# Patient Record
Sex: Male | Born: 1975 | Race: White | Hispanic: No | Marital: Married | Smoking: Never smoker
Health system: Southern US, Community
[De-identification: ages and names within clinical notes are randomized; demographics above are authoritative.]

## PROBLEM LIST (undated history)

## (undated) DIAGNOSIS — K5190 Ulcerative colitis, unspecified, without complications: Secondary | ICD-10-CM

## (undated) DIAGNOSIS — E7800 Pure hypercholesterolemia, unspecified: Secondary | ICD-10-CM

## (undated) DIAGNOSIS — R030 Elevated blood-pressure reading, without diagnosis of hypertension: Secondary | ICD-10-CM

## (undated) DIAGNOSIS — J101 Influenza due to other identified influenza virus with other respiratory manifestations: Secondary | ICD-10-CM

## (undated) DIAGNOSIS — K6289 Other specified diseases of anus and rectum: Secondary | ICD-10-CM

## (undated) HISTORY — DX: Elevated blood-pressure reading, without diagnosis of hypertension: R03.0

## (undated) HISTORY — DX: Pure hypercholesterolemia, unspecified: E78.00

## (undated) HISTORY — DX: Influenza due to other identified influenza virus with other respiratory manifestations: J10.1

## (undated) HISTORY — DX: Other specified diseases of anus and rectum: K62.89

## (undated) HISTORY — PX: COLONOSCOPY W/ BIOPSIES: SHX1374

## (undated) HISTORY — DX: Ulcerative colitis, unspecified, without complications: K51.90

---

## 2003-09-25 ENCOUNTER — Emergency Department (HOSPITAL_COMMUNITY): Admission: EM | Admit: 2003-09-25 | Discharge: 2003-09-25 | Payer: Self-pay | Admitting: Emergency Medicine

## 2014-05-22 ENCOUNTER — Encounter: Payer: Self-pay | Admitting: Family Medicine

## 2014-05-22 ENCOUNTER — Ambulatory Visit (INDEPENDENT_AMBULATORY_CARE_PROVIDER_SITE_OTHER): Payer: No Typology Code available for payment source | Admitting: Family Medicine

## 2014-05-22 VITALS — BP 151/95 | HR 108 | Temp 98.6°F | Resp 18 | Ht 69.0 in | Wt 191.0 lb

## 2014-05-22 DIAGNOSIS — K51918 Ulcerative colitis, unspecified with other complication: Secondary | ICD-10-CM | POA: Diagnosis not present

## 2014-05-22 DIAGNOSIS — Z23 Encounter for immunization: Secondary | ICD-10-CM

## 2014-05-22 DIAGNOSIS — R197 Diarrhea, unspecified: Secondary | ICD-10-CM

## 2014-05-22 DIAGNOSIS — K5190 Ulcerative colitis, unspecified, without complications: Secondary | ICD-10-CM | POA: Diagnosis not present

## 2014-05-22 LAB — COMPREHENSIVE METABOLIC PANEL
ALBUMIN: 3.8 g/dL (ref 3.5–5.2)
ALK PHOS: 73 U/L (ref 39–117)
ALT: 16 U/L (ref 0–53)
AST: 18 U/L (ref 0–37)
BUN: 15 mg/dL (ref 6–23)
CALCIUM: 9.6 mg/dL (ref 8.4–10.5)
CO2: 28 mEq/L (ref 19–32)
CREATININE: 1.1 mg/dL (ref 0.4–1.5)
Chloride: 100 mEq/L (ref 96–112)
GFR: 83.76 mL/min (ref >60.00)
GLUCOSE: 95 mg/dL (ref 70–99)
Potassium: 4.7 mEq/L (ref 3.5–5.1)
Sodium: 135 mEq/L (ref 135–145)
Total Bilirubin: 0.6 mg/dL (ref 0.2–1.2)
Total Protein: 8.3 g/dL (ref 6.0–8.3)

## 2014-05-22 LAB — CBC WITH DIFFERENTIAL/PLATELET
BASOS PCT: 0.3 % (ref 0.0–3.0)
Basophils Absolute: 0 10*3/uL (ref 0.0–0.1)
EOS PCT: 5.5 % — AB (ref 0.0–5.0)
Eosinophils Absolute: 0.4 10*3/uL (ref 0.0–0.7)
HCT: 48 % (ref 39.0–52.0)
Hemoglobin: 15.8 g/dL (ref 13.0–17.0)
Lymphocytes Relative: 18.5 % (ref 12.0–46.0)
Lymphs Abs: 1.2 10*3/uL (ref 0.7–4.0)
MCHC: 33 g/dL (ref 30.0–36.0)
MCV: 91.3 fl (ref 78.0–100.0)
MONO ABS: 0.6 10*3/uL (ref 0.1–1.0)
Monocytes Relative: 9.5 % (ref 3.0–12.0)
Neutro Abs: 4.2 10*3/uL (ref 1.4–7.7)
Neutrophils Relative %: 66.2 % (ref 43.0–77.0)
Platelets: 258 10*3/uL (ref 150.0–400.0)
RBC: 5.26 Mil/uL (ref 4.22–5.81)
RDW: 13.4 % (ref 11.5–15.5)
WBC: 6.4 10*3/uL (ref 4.0–10.5)

## 2014-05-22 MED ORDER — PREDNISONE 20 MG PO TABS: ORAL_TABLET | ORAL | Status: DC

## 2014-05-22 NOTE — Progress Notes (Signed)
Pre visit review using our clinic review tool, if applicable. No additional management support is needed unless otherwise documented below in the visit note. 

## 2014-05-22 NOTE — Assessment & Plan Note (Signed)
He feels like he knows his body and says that he does feel like this is a flare of his UC. Will give prednisone 16m qd x 5d, then 233mqd x 5d, then 10105md x 6d. Will refer to [facility] for ongoing f/u of this condition, particularly with regard to his question of oral "controller" med for this rather than suppository. However, he has admittedly had very little problem with this disease while not on any meds at all. Will check CBC and CMET today.

## 2014-05-22 NOTE — Progress Notes (Signed)
Office Note 05/22/2014  CC:  Chief Complaint  Patient presents with  . Rectal Bleeding   HPI:  Keith Jensen is a 38 y.o. White male who is here to establish care, discuss diarrhea that he suspects is a flare of his known dx of ulcerative colitis. Previous PMD: Dr. Inda Merlin at [facility] on [place]. Old records were not reviewed prior to or during today's visit.  Onset of diarrhea about 1 week ago, some crampiness in lower abdomen between BMs.  Typically 3 stools per day, maybe a tint of reddish blood in a mostly watery bm with some particulate matter but nothing formed. BM does not hurt when it comes out.  No fevers, no fatigue, no joint aches, no rash.  Appetite same.  He has not adjusted his diet much in response to the sx's.   Has ulcerative colitis, most recent flare was 2 yrs ago.   Says since his flares have been so infrequent he did not take his canasa, but just started it again during this flare: took a suppository yesterday and day before. He does not like taking suppositories and asks if there is anything else for [name] term treatment he can take that is oral, not suppository.  Past Medical History  Diagnosis Date  . Ulcerative colitis dx'd in early 38s    [facility] did dx but beyond this the condition was managed by his PCP.  Marland Kitchen Elevated blood pressure reading without diagnosis of hypertension     History reviewed. No pertinent past surgical history.  Family History  Problem Relation Age of Onset  . Heart attack Mother     per pt she committed suicide by med overdose    History   Social History  . Marital Status: Married    Spouse Name: N/A    Number of Children: N/A  . Years of Education: N/A   Occupational History  . Not on file.   Social History Main Topics  . Smoking status: Never Smoker   . Smokeless tobacco: Never Used  . Alcohol Use: Yes     Comment: 2-3 times weekly  . Drug Use: No  . Sexual Activity: Not on file   Other Topics Concern  . Not on  file   Social History Narrative   Married, no children.  Father healthy, sister healthy.  Mom d. Suicide.   Education: Scientist, product/process development.   Occupation: Engineer, mining.   No tobacco.  Beer 2-3 times per week.  No drugs.   Exercises: lifting wt's and biking.   MEDS: Canasa suppository as stated in HPI  No Known Allergies  ROS Review of Systems  Constitutional: Negative for fever, chills, appetite change and fatigue.  HENT: Negative for congestion, dental problem, ear pain and sore throat.   Eyes: Negative for discharge, redness and visual disturbance.  Respiratory: Negative for cough, chest tightness, shortness of breath and wheezing.   Cardiovascular: Negative for chest pain, palpitations and leg swelling.  Gastrointestinal: Positive for abdominal pain and diarrhea. Negative for nausea, vomiting, blood in stool, abdominal distention, anal bleeding and rectal pain.  Genitourinary: Negative for dysuria, urgency, frequency, hematuria, flank pain and difficulty urinating.  Musculoskeletal: Negative for arthralgias, back pain, joint swelling, myalgias and neck stiffness.  Skin: Negative for pallor and rash.  Neurological: Negative for dizziness, speech difficulty, weakness and headaches.  Hematological: Negative for adenopathy. Does not bruise/bleed easily.  Psychiatric/Behavioral: Negative for confusion and sleep disturbance. The patient is not nervous/anxious.     PE; Blood pressure  151/95, pulse 108, temperature 98.6 F (37 C), temperature source Temporal, resp. rate 18, height 5' 9"  (1.753 m), weight 191 lb (86.637 kg), SpO2 100.00%. Gen: Alert, well appearing.  Patient is oriented to person, place, time, and situation. SKS:HNGI: no injection, icteris, swelling, or exudate.  EOMI, PERRLA. Mouth: lips without lesion/swelling.  Oral mucosa pink and moist. Oropharynx without erythema, exudate, or swelling.  Neck - No masses or thyromegaly or limitation in range of motion CV: RRR, no  m/r/g.   LUNGS: CTA bilat, nonlabored resps, good aeration in all lung fields. ABD: soft, NT, ND, BS normal.  No hepatospenomegaly or mass.  No bruits. EXT: no clubbing, cyanosis, or edema.  Rectal: mild rectal tenderness, normal tone, no fissure or fistula tracts, no hemorrhoids, prostate without nodule or tenderness.   Hemoccult in the exam room: stool wipings equivocal pos  Pertinent labs:  None today  ASSESSMENT AND PLAN:   New pt; obtain old records.  Ulcerative colitis He feels like he knows his body and says that he does feel like this is a flare of his UC. Will give prednisone 34m qd x 5d, then 248mqd x 5d, then 1062md x 6d. Will refer to [name] for ongoing f/u of this condition, particularly with regard to his question of oral "controller" med for this rather than suppository. However, he has admittedly had very little problem with this disease while not on any meds at all. Will check CBC and CMET today.   An After Visit Summary was printed and given to the patient.  Return in about 4 weeks (around 06/19/2014) for f/u elevated bp.

## 2014-05-22 NOTE — Patient Instructions (Signed)
Check your blood pressure at least twice per week for the next 1 month. Bring these measurements to the office for f/u visit in 1 mo for review.

## 2014-05-23 ENCOUNTER — Encounter: Payer: Self-pay | Admitting: Family Medicine

## 2014-06-01 ENCOUNTER — Other Ambulatory Visit: Payer: Self-pay | Admitting: Family Medicine

## 2014-06-01 NOTE — Telephone Encounter (Signed)
Please advise refill? 

## 2014-06-04 NOTE — Telephone Encounter (Signed)
rf request for prednisone 20 mg.  Please advise.

## 2014-06-07 ENCOUNTER — Telehealth: Payer: Self-pay | Admitting: Family Medicine

## 2014-06-07 NOTE — Telephone Encounter (Signed)
Pt wife aware.  Patient will follow Dr. Idelle Leech advise.

## 2014-06-07 NOTE — Telephone Encounter (Signed)
LM for pt's wife to CB.

## 2014-06-07 NOTE — Telephone Encounter (Signed)
Patient Information:  Caller Name: Stanton Kidney  Phone: [phone]  Patient: Keith Jensen, Keith Jensen  Gender: Male  DOB: 27-Apr-1976  Age: 38 Years  PCP: Ricardo [facility])  Office Follow Up:  Does the office need to follow up with this patient?: Yes  Instructions For The Office: Please f/u with pt's spouse/Mary to advise, thank you.  RN Note:  Daneil Dan to call [facility] to inquire if any cancellations.  Symptoms  Reason For Call & Symptoms: Pt's spouse/Mary reports pt has bloody  diarrhea with  abdominal cramping.  Stanton Kidney  reports he was seen in the office and on prednisone but abdominal pain and bloody diarrhea is not improving.  Mary requesting call back from provider.  Stanton Kidney states pt has appt with gastroenterolgy 08/01/14.  Reviewed Health History In EMR: Yes  Reviewed Medications In EMR: Yes  Reviewed Allergies In EMR: Yes  Reviewed Surgeries / Procedures: Yes  Date of Onset of Symptoms: 05/08/2014  Guideline(s) Used:  No Protocol Available - Sick Adult  Disposition Per Guideline:   Discuss with PCP and Callback by Nurse Today  Reason For Disposition Reached:   Nursing judgment  Advice Given:  Call Back If:  New symptoms develop  You become worse.  Call Back If:  New symptoms develop  You become worse.  Patient Will Follow Care Advice:  YES

## 2014-06-07 NOTE — Telephone Encounter (Signed)
Please advise 

## 2014-06-07 NOTE — Telephone Encounter (Signed)
I recommend this patient go to the ED for evaluation.

## 2014-06-13 ENCOUNTER — Ambulatory Visit (INDEPENDENT_AMBULATORY_CARE_PROVIDER_SITE_OTHER): Payer: No Typology Code available for payment source | Admitting: Physician Assistant

## 2014-06-13 ENCOUNTER — Encounter: Payer: Self-pay | Admitting: Physician Assistant

## 2014-06-13 VITALS — BP 124/80 | HR 74 | Ht 69.0 in | Wt 180.0 lb

## 2014-06-13 DIAGNOSIS — K51919 Ulcerative colitis, unspecified with unspecified complications: Secondary | ICD-10-CM

## 2014-06-13 DIAGNOSIS — R197 Diarrhea, unspecified: Secondary | ICD-10-CM

## 2014-06-13 MED ORDER — MESALAMINE 1.2 G PO TBEC: 2.4000 g | DELAYED_RELEASE_TABLET | Freq: Every day | ORAL | Status: DC

## 2014-06-13 MED ORDER — MOVIPREP 100 G PO SOLR: 1.0000 | Freq: Once | ORAL | Status: DC

## 2014-06-13 MED ORDER — PREDNISONE 10 MG PO TABS: ORAL_TABLET | ORAL | Status: DC

## 2014-06-13 MED ORDER — MESALAMINE 1000 MG RE SUPP: 1000.0000 mg | Freq: Two times a day (BID) | RECTAL | Status: DC

## 2014-06-13 NOTE — Progress Notes (Signed)
Reviewed and agree with management plan. In addition please send a full GI pathogen panel to R/O multiple potential infectious etiologies.   Pricilla Riffle. Fuller Plan, MD [facility]

## 2014-06-13 NOTE — Progress Notes (Signed)
Patient ID: Keith Jensen, male   DOB: 1976/05/30, 38 y.o.   MRN: [id number]    HPI:  Keith Jensen is 38 year old male referred for evaluation by Dr. Nanine Means due to a Kratzke-standing history of ulcerative colitis.  Patient states that he was diagnosed with ulcerative active colitis 15 years ago. He was primarily managed by his primary care provider, Dr. Inda Merlin at the time. He reports that he has seen a gastroenterologist at [facility] a few times however he doesn't remember the name of the gastroenterologist.for a short period of time, due to insurance reasons he also saw a gastroenterologist in [facility] but he does not know the physician's name and says he does not remember where the offices. He has never been on oral medications for his ulcerative colitis. His last flare was in 2012. He last had a flexible sigmoidoscopy 5 or 6 years ago. 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   On October 15 of this year he started to have lower abdominal cramping, bloody diarrhea and tenesmus, and mucous. He was evaluated at Dr. Rulon Sera office and started on prednisone. While on 40 mg of prednisone he felt well but once he got below 40 mg his diarrhea got worse. He is currently having 10-12 watery bowel movements with blood and urgency daily. He does have nocturnal stooling. He has constant tenesmus. He had been using ibuprofen intermittently through September and early October for joint pain but discontinued this when his diarrhea started. He has no nausea or vomiting. He states he is afraid to eat because everything runs right through him. 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  Past Medical History  Diagnosis Date  . 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  [facility] notes state that since 2001 he had multiple episodes of proctitis, proctoscopy done 2005 but biopsies not performed, then pt  referred to GI and diagnostic colonoscopy was recommended but pt failed to schedule this b/c his bleeding resolved (most recent GI visit as per their old notes is 01/18/2012, with Dr. Wynetta Emery)  . Elevated blood pressure reading without diagnosis of hypertension   . UC (ulcerative colitis)   . Ulcerative colitis     Past Surgical History  Procedure Laterality Date  . Neg hx     Family History  Problem Relation Age of Onset  . Heart attack Mother     per pt she had accidental overdose  . Colon cancer Neg Hx   . Colon polyps Father    History  Substance Use Topics  . Smoking status: Never Smoker   . Smokeless tobacco: Never Used  . Alcohol Use: Yes     Comment: 2-3 times weekly   No current outpatient prescriptions on file.   No current facility-administered medications for this visit.   No Known Allergies   Review of Systems: Gen: Denies any fever, chills, sweats, anorexia, fatigue, weakness, malaise, weight loss, and sleep disorder CV: Denies chest pain, angina, palpitations, syncope, orthopnea, PND, peripheral edema, and claudication. Resp: Denies dyspnea at rest, dyspnea with exercise, cough, sputum, wheezing, coughing up blood, and pleurisy. GI: Denies vomiting blood, jaundice, and fecal incontinence.   Denies dysphagia or odynophagia. GU : Denies urinary burning, blood in urine, urinary frequency, urinary hesitancy, nocturnal urination, and urinary incontinence. MS: Denies joint pain, limitation of movement, and  swelling, stiffness, low back pain, extremity pain. Denies muscle weakness, cramps, atrophy.  Derm: Denies rash, itching, dry skin, hives, moles, warts, or unhealing ulcers.  Psych: Denies depression, anxiety, memory loss, suicidal ideation, hallucinations, paranoia, and confusion. Heme: Denies bruising, bleeding, and enlarged lymph nodes. Neuro:  Denies any headaches, dizziness, paresthesias. Endo:  Denies any problems with DM, thyroid, adrenal function   LAB  RESULTS: Blood work on 05/22/2013 had a CBC with a white blood count of 6.4, hemoglobin 15.8, hematocrit 48, platelets 258,000.  Chem profile showed AST 18, ALT 16, total bili 0.6, alkaline phosphatase 73.  Physical Exam: BP 124/80 mmHg  Pulse 74  Ht 5' 9"  (1.753 m)  Wt 180 lb (81.647 kg)  BMI 26.57 kg/m2 Constitutional: Pleasant,well-developed,male in no acute distress. HEENT: Normocephalic and atraumatic. Conjunctivae are normal. No scleral icterus. Neck supple. No thyromegaly Cardiovascular: Normal rate, regular rhythm.  Pulmonary/chest: Effort normal and breath sounds normal. No wheezing, rales or rhonchi. Abdominal: Soft, nondistended, nontender. Bowel sounds active throughout. There are no masses palpable. No hepatomegaly. Rectal:bright red blood and mucus Extremities: no edema Lymphadenopathy: No cervical adenopathy noted. Neurological: Alert and oriented to person place and time. Skin: Skin is warm and dry. No rashes noted. Psychiatric: Normal mood and affect. Behavior is normal.  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 He appears to be experiencing a flare and will be started on prednisone 60 mg daily and decrease by 10 mg weekly to complete. He will be started on Lialda 1.2 g 2 tablets each morning along with Canasa suppositories twice a day. A stool for C. Difficile will be obtained. He will be scheduled for a colonoscopy to assess the extent of disease involvement.The risks, benefits, and alternatives to colonoscopy with possible biopsy and possible polypectomy were discussed with the patient and they consent to proceed.  The procedure will be scheduled with Dr. Fuller Plan who the case has been reviewed with.the patient will follow up in the office 2 weeks after his colonoscopy to assess response to the above-mentioned therapy. He has however, been advised to call us at any time if he is having  worsening symptoms or has any questions.    Tryton Bodi, Vita Barley PA-C 06/13/2014, 11:24 AM

## 2014-06-13 NOTE — Addendum Note (Signed)
Addended by: Jackolyn Confer A on: 06/13/2014 02:25 PM   Modules accepted: Orders

## 2014-06-13 NOTE — Patient Instructions (Signed)
You have been scheduled for a colonoscopy. Please follow written instructions given to you at your visit today.  Please pick up your prep kit at the pharmacy within the next 1-3 days. If you use inhalers (even only as needed), please bring them with you on the day of your procedure. Your physician has requested that you go to www.startemmi.com and enter the access code given to you at your visit today. This web site gives a general overview about your procedure. However, you should still follow specific instructions given to you by our office regarding your preparation for the procedure.  We have sent the following medications to your pharmacy for you to pick up at your convenience: Prednisone Lialda Canasa suppositories  Your physician has requested that you go to the basement for the following lab work before leaving today: Stool for C-diff  You will be due for a recall office visit in12/2015. We will send you a reminder in the mail when it gets closer to that time.  Low-Fiber Diet Fiber is found in fruits, vegetables, and whole grains. A low-fiber diet restricts fibrous foods that are not digested in the small intestine. A diet containing about 10-15 grams of fiber per day is considered low fiber. Low-fiber diets may be used to:  Promote healing and rest the bowel during intestinal flare-ups.  Prevent blockage of a partially obstructed or narrowed gastrointestinal tract.  Reduce fecal weight and volume.  Slow the movement of feces. You may be on a low-fiber diet as a transitional diet following surgery, after an injury (trauma), or because of a short (acute) or lifelong (chronic) illness. Your health care provider will determine the length of time you need to stay on this diet.  WHAT DO I NEED TO KNOW ABOUT A LOW-FIBER DIET? Always check the fiber content on the packaging's Nutrition Facts label, especially on foods from the grains list. Ask your dietitian if you have questions about  specific foods that are related to your condition, especially if the food is not listed below. In general, a low-fiber food will have less than 2 g of fiber. WHAT FOODS CAN I EAT? Grains All breads and crackers made with white flour. Sweet rolls, doughnuts, waffles, pancakes, [place] toast, bagels. Pretzels, Melba toast, zwieback. Well-cooked cereals, such as cornmeal, farina, or cream cereals. Dry cereals that do not contain whole grains, fruit, or nuts, such as refined corn, wheat, rice, and oat cereals. Potatoes prepared any way without skins, plain pastas and noodles, refined white rice. Use white flour for baking and making sauces. Use allowed list of grains for casseroles, dumplings, and puddings.  Vegetables Strained tomato and vegetable juices. Fresh lettuce, cucumber, spinach. Well-cooked (no skin or pulp) or canned vegetables, such as asparagus, bean sprouts, beets, carrots, green beans, mushrooms, potatoes, pumpkin, spinach, yellow squash, tomato sauce/puree, turnips, yams, and zucchini. Keep servings limited to  cup.  Fruits All fruit juices except prune juice. Cooked or canned fruits without skin and seeds, such as applesauce, apricots, cherries, fruit cocktail, grapefruit, grapes, mandarin oranges, melons, peaches, pears, pineapple, and plums. Fresh fruits without skin, such as apricots, avocados, bananas, melons, pineapple, nectarines, and peaches. Keep servings limited to  cup or 1 piece.  Meat and Other Protein Sources Ground or well-cooked tender beef, ham, veal, lamb, pork, or poultry. Eggs, plain cheese. Fish, oysters, shrimp, lobster, and other seafood. Liver, organ meats. Smooth nut butters. Dairy All milk products and alternative dairy substitutes, such as soy, rice, almond, and coconut, not  containing added whole nuts, seeds, or added fruit. Beverages Decaf coffee, fruit, and vegetable juices or smoothies (small amounts, with no pulp or skins, and with fruits from allowed  list), sports drinks, herbal tea. Condiments Ketchup, mustard, vinegar, cream sauce, cheese sauce, cocoa powder. Spices in moderation, such as allspice, basil, bay leaves, celery powder or leaves, cinnamon, cumin powder, curry powder, ginger, mace, marjoram, onion or garlic powder, oregano, paprika, parsley flakes, ground pepper, rosemary, sage, savory, tarragon, thyme, and turmeric. Sweets and Desserts Plain cakes and cookies, pie made with allowed fruit, pudding, custard, cream pie. Gelatin, fruit, ice, sherbet, frozen ice pops. Ice cream, ice milk without nuts. Plain hard candy, honey, jelly, molasses, syrup, sugar, chocolate syrup, gumdrops, marshmallows. Limit overall sugar intake.  Fats and Oil Margarine, butter, cream, mayonnaise, salad oils, plain salad dressings made from allowed foods. Choose healthy fats such as olive oil, canola oil, and omega-3 fatty acids (such as found in salmon or tuna) when possible.  Other Bouillon, broth, or cream soups made from allowed foods. Any strained soup. Casseroles or mixed dishes made with allowed foods. The items listed above may not be a complete list of recommended foods or beverages. Contact your dietitian for more options.  WHAT FOODS ARE NOT RECOMMENDED? Grains All whole wheat and whole grain breads and crackers. Multigrains, rye, bran seeds, nuts, or coconut. Cereals containing whole grains, multigrains, bran, coconut, nuts, raisins. Cooked or dry oatmeal, steel-cut oats. Coarse wheat cereals, granola. Cereals advertised as high fiber. Potato skins. Whole grain pasta, wild or brown rice. Popcorn. Coconut flour. Bran, buckwheat, corn bread, multigrains, rye, wheat germ.  Vegetables Fresh, cooked or canned vegetables, such as artichokes, asparagus, beet greens, broccoli, Brussels sprouts, cabbage, celery, cauliflower, corn, eggplant, kale, legumes or beans, okra, peas, and tomatoes. Avoid large servings of any vegetables, especially raw vegetables.   Fruits Fresh fruits, such as apples with or without skin, berries, cherries, figs, grapes, grapefruit, guavas, kiwis, mangoes, oranges, papayas, pears, persimmons, pineapple, and pomegranate. Prune juice and juices with pulp, stewed or dried prunes. Dried fruits, dates, raisins. Fruit seeds or skins. Avoid large servings of all fresh fruits. Meats and Other Protein Sources Tough, fibrous meats with gristle. Chunky nut butter. Cheese made with seeds, nuts, or other foods not recommended. Nuts, seeds, legumes (beans, including baked beans), dried peas, beans, lentils.  Dairy Yogurt or cheese that contains nuts, seeds, or added fruit.  Beverages Fruit juices with high pulp, prune juice. Caffeinated coffee and teas.  Condiments Coconut, maple syrup, pickles, olives. Sweets and Desserts Desserts, cookies, or candies that contain nuts or coconut, chunky peanut butter, dried fruits. Jams, preserves with seeds, marmalade. Large amounts of sugar and sweets. Any other dessert made with fruits from the not recommended list.  Other Soups made from vegetables that are not recommended or that contain other foods not recommended.  The items listed above may not be a complete list of foods and beverages to avoid. Contact your dietitian for more information. Document Released: 01/16/2002 Document Revised: 08/01/2013 Document Reviewed: 06/19/2013 [facility] Patient Information 2015 [place], [place]. This information is not intended to replace advice given to you by your health care provider. Make sure you discuss any questions you have with your health care provider.

## 2014-06-14 ENCOUNTER — Telehealth: Payer: Self-pay | Admitting: Gastroenterology

## 2014-06-15 NOTE — Telephone Encounter (Signed)
Patient's wife advised that needs a GI pathogen panel and will need to come in and pick up the container from the lab

## 2014-06-18 ENCOUNTER — Telehealth: Payer: Self-pay | Admitting: Physician Assistant

## 2014-06-18 NOTE — Telephone Encounter (Signed)
Spoke with patient's wife and she states the patient is having a lot of abdominal cramping and last night had 4 bloody episodes without stool. He is hurting in lower abdomen. She states he is just passing blood without stool now. He is taking Prednisone 60 mg, Canasa suppositories and Lialda 2 tablets daily. Spoke with Cecille Rubin Hvozdovic, PA-C and patient needs to go to [facility] ED for evaluation. Spoke with patient's wife and gave her recommendation. She states patient will go to ED.

## 2014-06-20 ENCOUNTER — Emergency Department (HOSPITAL_COMMUNITY): Payer: No Typology Code available for payment source

## 2014-06-20 ENCOUNTER — Encounter (HOSPITAL_COMMUNITY): Payer: Self-pay | Admitting: Emergency Medicine

## 2014-06-20 ENCOUNTER — Ambulatory Visit: Payer: No Typology Code available for payment source | Admitting: Family Medicine

## 2014-06-20 ENCOUNTER — Inpatient Hospital Stay (HOSPITAL_COMMUNITY)
Admission: EM | Admit: 2014-06-20 | Discharge: 2014-06-26 | DRG: 387 | Disposition: A | Discharge status: Home / Self Care | Payer: No Typology Code available for payment source | Attending: Internal Medicine | Admitting: Internal Medicine

## 2014-06-20 DIAGNOSIS — E876 Hypokalemia: Secondary | ICD-10-CM | POA: Diagnosis not present

## 2014-06-20 DIAGNOSIS — D72829 Elevated white blood cell count, unspecified: Secondary | ICD-10-CM | POA: Diagnosis present

## 2014-06-20 DIAGNOSIS — R109 Unspecified abdominal pain: Secondary | ICD-10-CM | POA: Diagnosis present

## 2014-06-20 DIAGNOSIS — Z7952 Long term (current) use of systemic steroids: Secondary | ICD-10-CM | POA: Diagnosis not present

## 2014-06-20 DIAGNOSIS — Z8249 Family history of ischemic heart disease and other diseases of the circulatory system: Secondary | ICD-10-CM

## 2014-06-20 DIAGNOSIS — Z79899 Other long term (current) drug therapy: Secondary | ICD-10-CM | POA: Diagnosis not present

## 2014-06-20 DIAGNOSIS — T380X5A Adverse effect of glucocorticoids and synthetic analogues, initial encounter: Secondary | ICD-10-CM | POA: Diagnosis present

## 2014-06-20 DIAGNOSIS — K51911 Ulcerative colitis, unspecified with rectal bleeding: Secondary | ICD-10-CM

## 2014-06-20 DIAGNOSIS — K5190 Ulcerative colitis, unspecified, without complications: Secondary | ICD-10-CM | POA: Diagnosis present

## 2014-06-20 DIAGNOSIS — Z8371 Family history of colonic polyps: Secondary | ICD-10-CM | POA: Diagnosis not present

## 2014-06-20 DIAGNOSIS — K51918 Ulcerative colitis, unspecified with other complication: Secondary | ICD-10-CM | POA: Diagnosis not present

## 2014-06-20 DIAGNOSIS — Z139 Encounter for screening, unspecified: Secondary | ICD-10-CM

## 2014-06-20 LAB — CBC WITH DIFFERENTIAL/PLATELET
Basophils Absolute: 0 10*3/uL (ref 0.0–0.1)
Basophils Relative: 0 % (ref 0–1)
EOS ABS: 0 10*3/uL (ref 0.0–0.7)
EOS PCT: 0 % (ref 0–5)
HCT: 38.6 % — ABNORMAL LOW (ref 39.0–52.0)
Hemoglobin: 13 g/dL (ref 13.0–17.0)
LYMPHS PCT: 8 % — AB (ref 12–46)
Lymphs Abs: 1.3 10*3/uL (ref 0.7–4.0)
MCH: 30 pg (ref 26.0–34.0)
MCHC: 33.7 g/dL (ref 30.0–36.0)
MCV: 89.1 fL (ref 78.0–100.0)
MONOS PCT: 18 % — AB (ref 3–12)
Monocytes Absolute: 2.8 10*3/uL — ABNORMAL HIGH (ref 0.1–1.0)
Neutro Abs: 11.6 10*3/uL — ABNORMAL HIGH (ref 1.7–7.7)
Neutrophils Relative %: 74 % (ref 43–77)
PLATELETS: 403 10*3/uL — AB (ref 150–400)
RBC: 4.33 MIL/uL (ref 4.22–5.81)
RDW: 12.8 % (ref 11.5–15.5)
WBC: 15.7 10*3/uL — AB (ref 4.0–10.5)

## 2014-06-20 LAB — SEDIMENTATION RATE: Sed Rate: 60 mm/hr — ABNORMAL HIGH (ref 0–16)

## 2014-06-20 LAB — COMPREHENSIVE METABOLIC PANEL
ALK PHOS: 66 U/L (ref 39–117)
ALT: 11 U/L (ref 0–53)
ANION GAP: 13 (ref 5–15)
AST: 8 U/L (ref 0–37)
Albumin: 2.6 g/dL — ABNORMAL LOW (ref 3.5–5.2)
BUN: 12 mg/dL (ref 6–23)
CO2: 30 meq/L (ref 19–32)
Calcium: 8.7 mg/dL (ref 8.4–10.5)
Chloride: 94 mEq/L — ABNORMAL LOW (ref 96–112)
Creatinine, Ser: 1 mg/dL (ref 0.50–1.35)
GFR calc non Af Amer: >90 mL/min (ref >90)
GLUCOSE: 112 mg/dL — AB (ref 70–99)
POTASSIUM: 3.4 meq/L — AB (ref 3.7–5.3)
SODIUM: 137 meq/L (ref 137–147)
Total Bilirubin: 0.2 mg/dL — ABNORMAL LOW (ref 0.3–1.2)
Total Protein: 7 g/dL (ref 6.0–8.3)

## 2014-06-20 LAB — LIPASE, BLOOD: Lipase: 31 U/L (ref 11–59)

## 2014-06-20 MED ORDER — MORPHINE SULFATE 4 MG/ML IJ SOLN
4.0000 mg | INTRAMUSCULAR | Status: AC | PRN
  Administered 2014-06-20 – 2014-06-21 (×3): 4 mg via INTRAVENOUS
  Filled 2014-06-20 (×3): qty 1

## 2014-06-20 MED ORDER — IOHEXOL 300 MG/ML  SOLN
50.0000 mL | Freq: Once | INTRAMUSCULAR | Status: AC | PRN
  Administered 2014-06-20: 50 mL via ORAL

## 2014-06-20 MED ORDER — DIPHENHYDRAMINE HCL 25 MG PO CAPS
25.0000 mg | ORAL_CAPSULE | Freq: Once | ORAL | Status: AC
  Administered 2014-06-20: 25 mg via ORAL
  Filled 2014-06-20: qty 1

## 2014-06-20 MED ORDER — SODIUM CHLORIDE 0.9 % IV BOLUS (SEPSIS)
1000.0000 mL | Freq: Once | INTRAVENOUS | Status: AC
  Administered 2014-06-20: 1000 mL via INTRAVENOUS

## 2014-06-20 MED ORDER — SODIUM CHLORIDE 0.9 % IV SOLN
Freq: Once | INTRAVENOUS | Status: AC
Start: 2014-06-20 — End: 2014-06-21
  Administered 2014-06-20: 22:00:00 via INTRAVENOUS

## 2014-06-20 MED ORDER — METHYLPREDNISOLONE SODIUM SUCC 40 MG IJ SOLR
40.0000 mg | Freq: Once | INTRAMUSCULAR | Status: AC
  Administered 2014-06-20: 40 mg via INTRAVENOUS
  Filled 2014-06-20: qty 1

## 2014-06-20 MED ORDER — ONDANSETRON HCL 4 MG/2ML IJ SOLN
4.0000 mg | Freq: Once | INTRAMUSCULAR | Status: AC
  Administered 2014-06-20: 4 mg via INTRAVENOUS
  Filled 2014-06-20: qty 2

## 2014-06-20 MED ORDER — IOHEXOL 300 MG/ML  SOLN
100.0000 mL | Freq: Once | INTRAMUSCULAR | Status: AC | PRN
  Administered 2014-06-20: 100 mL via INTRAVENOUS

## 2014-06-20 NOTE — ED Notes (Signed)
Patient states he has been experiencing dark blood in his stool. Seen at PCP a few weeks ago, given low dose of PREDNISONE. Seen GI specialist last Wednesday, placed on a higher dose of PREDNISONE but not getting better.

## 2014-06-20 NOTE — ED Provider Notes (Addendum)
CSN: [id number]     Arrival date & time 06/20/14  1809 History   First MD Initiated Contact with Patient 06/20/14 1915     Chief Complaint  Patient presents with  . Abdominal Pain  . Diarrhea      HPI  Patient presents for evaluation of abdominal pain and rectal bleeding. Originally diagnosed in 2001 with ulcerative colitis. Has had infrequent flares. Only approximately every 2-3 years. Last was in 2012. Started with symptoms approximate 6 weeks ago. Abdominal plain and intermittent bleeding. Seen by his primary care physician and ultimately by GI 7 days ago.  Had been on low-dose prednisone. 7 days ago started on a more aggressive taper. Starting at 60 mg. Had taken that for a week and was to taper today to 50. Continues having daily and daily pain. Not lightheaded weak dizzy. No fevers chills nausea vomiting. No localizing pain is diffuse.  Her medications include Canasa suppositories, mesalamine and 50 of by mouth prednisone daily.  Never been on methotrexate or any other immune modulators for his disease because of his infrequent flares.  Past Medical History  Diagnosis Date  . 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  [facility] notes state that since 2001 he had multiple episodes of proctitis, proctoscopy done 2005 but biopsies not performed, then pt referred to GI and diagnostic colonoscopy was recommended but pt failed to schedule this b/c his bleeding resolved (most recent GI visit as per their old notes is 01/18/2012, with Dr. Wynetta Emery)  . Elevated blood pressure reading without diagnosis of hypertension   . UC (ulcerative colitis)   . Ulcerative colitis    Past Surgical History  Procedure Laterality Date  . Neg hx     Family History  Problem Relation Age of Onset  . Heart attack Mother     per pt she had accidental overdose  . Colon cancer Neg Hx   . Colon polyps Father    History  Substance Use Topics  . Smoking status:  Never Smoker   . Smokeless tobacco: Never Used  . Alcohol Use: Yes     Comment: 2-3 times weekly    Review of Systems  Constitutional: Negative for fever, chills, diaphoresis, appetite change and fatigue.  HENT: Negative for mouth sores, sore throat and trouble swallowing.   Eyes: Negative for visual disturbance.  Respiratory: Negative for cough, chest tightness, shortness of breath and wheezing.   Cardiovascular: Negative for chest pain.  Gastrointestinal: Positive for abdominal pain and anal bleeding. Negative for nausea, vomiting, diarrhea and abdominal distention.  Endocrine: Negative for polydipsia, polyphagia and polyuria.  Genitourinary: Negative for dysuria, frequency and hematuria.  Musculoskeletal: Negative for gait problem.  Skin: Negative for color change, pallor and rash.  Neurological: Negative for dizziness, syncope, light-headedness and headaches.  Hematological: Does not bruise/bleed easily.  Psychiatric/Behavioral: Negative for behavioral problems and confusion.      Allergies  Review of patient's allergies indicates no known allergies.  Home Medications   Prior to Admission medications   Medication Sig Start Date End Date Taking? Authorizing Provider  mesalamine (CANASA) 1000 MG suppository Place 1 suppository (1,000 mg total) rectally 2 (two) times daily. 06/13/14  Yes Lori P Hvozdovic, PA-C  mesalamine (LIALDA) 1.2 G EC tablet Take 2 tablets (2.4 g total) by mouth daily with breakfast. 06/13/14  Yes Lori P Hvozdovic, PA-C  predniSONE (DELTASONE) 10 MG tablet Take 60 mg by mouth for 7 days,  then 50 mg for 7 days, then 40 mg for 7 days, then 30 mg for 7 days, then 20 mg for 7 days, then 10 mg for 7 days then discontinue 06/13/14  Yes Lori P Hvozdovic, PA-C  MOVIPREP 100 G SOLR Take 1 kit (200 g total) by mouth once. 06/13/14   Lori P Hvozdovic, PA-C   BP 130/76 mmHg  Pulse 75  Temp(Src) 98.6 F (37 C) (Oral)  Resp 18  SpO2 100% Physical Exam  Constitutional:  He is oriented to person, place, and time. He appears well-developed and well-nourished. No distress.  HENT:  Head: Normocephalic.  Eyes: Conjunctivae are normal. Pupils are equal, round, and reactive to light. No scleral icterus.  Neck: Normal range of motion. Neck supple. No thyromegaly present.  Cardiovascular: Normal rate and regular rhythm.  Exam reveals no gallop and no friction rub.   No murmur heard. Pulmonary/Chest: Effort normal and breath sounds normal. No respiratory distress. He has no wheezes. He has no rales.  Abdominal: Soft. Bowel sounds are normal. He exhibits no distension. There is no tenderness. There is no rebound.    Musculoskeletal: Normal range of motion.  Neurological: He is alert and oriented to person, place, and time.  Skin: Skin is warm and dry. No rash noted.  Psychiatric: He has a normal mood and affect. His behavior is normal.    ED Course  Procedures (including critical care time) Labs Review Labs Reviewed  CBC WITH DIFFERENTIAL - Abnormal; Notable for the following:    WBC 15.7 (*)    HCT 38.6 (*)    Platelets 403 (*)    Neutro Abs 11.6 (*)    Lymphocytes Relative 8 (*)    Monocytes Relative 18 (*)    Monocytes Absolute 2.8 (*)    All other components within normal limits  COMPREHENSIVE METABOLIC PANEL - Abnormal; Notable for the following:    Potassium 3.4 (*)    Chloride 94 (*)    Glucose, Bld 112 (*)    Albumin 2.6 (*)    Total Bilirubin 0.2 (*)    All other components within normal limits  SEDIMENTATION RATE - Abnormal; Notable for the following:    Sed Rate 60 (*)    All other components within normal limits  LIPASE, BLOOD    Imaging Review Ct Abdomen Pelvis W Contrast  06/20/2014   CLINICAL DATA:  History of ulcerative colitis with mid abdominal pain and diarrhea 1 week.  EXAM: CT ABDOMEN AND PELVIS WITH CONTRAST  TECHNIQUE: Multidetector CT imaging of the abdomen and pelvis was performed using the standard protocol following  bolus administration of intravenous contrast.  CONTRAST:  155m OMNIPAQUE IOHEXOL 300 MG/ML SOLN, 57mOMNIPAQUE IOHEXOL 300 MG/ML SOLN  COMPARISON:  03/28/2004  FINDINGS: Lung bases are within normal.  Abdominal images demonstrate a normal liver, spleen, pancreas, gallbladder and adrenal glands. Kidneys are normal in size without hydronephrosis or nephrolithiasis. Vascular structures are within normal. The appendix is normal. Ureters are normal.  There is mild wall thickening throughout the entire colon without significant adjacent inflammatory change. No evidence of obstruction. Findings are compatible patient's history of ulcerative colitis. These findings are new compared to the previous exam. Small bowel is unremarkable.  Pelvic images demonstrate a normal bladder and prostate. Remaining bones and soft tissues are unremarkable.  IMPRESSION: Mild wall thickening throughout the entire colon without significant adjacent mesenteric inflammatory or free fluid. Findings are compatible with patient's known ulcerative colitis likely representing active inflammation.  Electronically Signed   By: Marin Olp M.D.   On: 06/20/2014 22:55     EKG Interpretation None      MDM   Final diagnoses:  Ulcerative colitis     Sedimentation rate elevated at 60. WBC 15.7. Hb 13.0   Renal function intact. CT shows diffuse colonic thickening without other complication.    I discussed the case with her GI. Recommendations for admission for IV steroids 2/2 failed outpatient therapy.  I placed a call to [facility]  hospitalist regarding admission. Patient is more comfortable after IV pain meds.   Tanna Furry, MD 06/20/14 [id number]  Tanna Furry, MD 06/20/14 [phone]

## 2014-06-20 NOTE — ED Notes (Signed)
Lights dimmed for comfort.

## 2014-06-20 NOTE — ED Notes (Signed)
Pt transported to CT ?

## 2014-06-20 NOTE — ED Notes (Signed)
Spoke with lab about adding on Sedimentation Rate for blood that is already in main lab.

## 2014-06-20 NOTE — ED Notes (Signed)
Pt w/ hx of UC states he has been having mid abd pain and diarrhea x 1 wk.  Has been on prednisone from PCP for same.

## 2014-06-21 ENCOUNTER — Encounter (HOSPITAL_COMMUNITY): Payer: Self-pay | Admitting: Internal Medicine

## 2014-06-21 DIAGNOSIS — K51918 Ulcerative colitis, unspecified with other complication: Secondary | ICD-10-CM

## 2014-06-21 DIAGNOSIS — E876 Hypokalemia: Secondary | ICD-10-CM | POA: Diagnosis present

## 2014-06-21 LAB — COMPREHENSIVE METABOLIC PANEL
ALT: 7 U/L (ref 0–53)
AST: 6 U/L (ref 0–37)
Albumin: 2.1 g/dL — ABNORMAL LOW (ref 3.5–5.2)
Alkaline Phosphatase: 53 U/L (ref 39–117)
Anion gap: 8 (ref 5–15)
BUN: 9 mg/dL (ref 6–23)
CALCIUM: 8.1 mg/dL — AB (ref 8.4–10.5)
CO2: 29 meq/L (ref 19–32)
Chloride: 97 mEq/L (ref 96–112)
Creatinine, Ser: 0.92 mg/dL (ref 0.50–1.35)
GFR calc Af Amer: >90 mL/min (ref >90)
GFR calc non Af Amer: >90 mL/min (ref >90)
Glucose, Bld: 122 mg/dL — ABNORMAL HIGH (ref 70–99)
Potassium: 3.8 mEq/L (ref 3.7–5.3)
SODIUM: 134 meq/L — AB (ref 137–147)
Total Bilirubin: <0.2 mg/dL — ABNORMAL LOW (ref 0.3–1.2)
Total Protein: 5.6 g/dL — ABNORMAL LOW (ref 6.0–8.3)

## 2014-06-21 LAB — CBC
HEMATOCRIT: 34.3 % — AB (ref 39.0–52.0)
HEMOGLOBIN: 11.1 g/dL — AB (ref 13.0–17.0)
MCH: 29.1 pg (ref 26.0–34.0)
MCHC: 32.4 g/dL (ref 30.0–36.0)
MCV: 90 fL (ref 78.0–100.0)
Platelets: 351 10*3/uL (ref 150–400)
RBC: 3.81 MIL/uL — AB (ref 4.22–5.81)
RDW: 12.8 % (ref 11.5–15.5)
WBC: 10.1 10*3/uL (ref 4.0–10.5)

## 2014-06-21 LAB — MAGNESIUM: Magnesium: 2.2 mg/dL (ref 1.5–2.5)

## 2014-06-21 LAB — HEPATITIS B SURFACE ANTIGEN: Hepatitis B Surface Ag: NEGATIVE

## 2014-06-21 LAB — HEPATITIS B SURFACE ANTIBODY,QUALITATIVE: Hep B S Ab: NEGATIVE

## 2014-06-21 LAB — TSH: TSH: 1.98 u[IU]/mL (ref 0.350–4.500)

## 2014-06-21 LAB — PHOSPHORUS: Phosphorus: 3.2 mg/dL (ref 2.3–4.6)

## 2014-06-21 MED ORDER — HYDROCODONE-ACETAMINOPHEN 5-325 MG PO TABS
1.0000 | ORAL_TABLET | ORAL | Status: DC | PRN
  Administered 2014-06-21 – 2014-06-26 (×18): 2 via ORAL
  Filled 2014-06-21 (×19): qty 2

## 2014-06-21 MED ORDER — ACETAMINOPHEN 325 MG PO TABS: 650.0000 mg | ORAL_TABLET | Freq: Four times a day (QID) | ORAL | Status: DC | PRN

## 2014-06-21 MED ORDER — MESALAMINE 1.2 G PO TBEC
2.4000 g | DELAYED_RELEASE_TABLET | Freq: Every day | ORAL | Status: DC
  Filled 2014-06-21 (×3): qty 2

## 2014-06-21 MED ORDER — ONDANSETRON HCL 4 MG PO TABS
4.0000 mg | ORAL_TABLET | Freq: Four times a day (QID) | ORAL | Status: DC | PRN
Start: 2014-06-21 — End: 2014-06-26

## 2014-06-21 MED ORDER — METHYLPREDNISOLONE SODIUM SUCC 40 MG IJ SOLR
40.0000 mg | Freq: Two times a day (BID) | INTRAMUSCULAR | Status: DC
  Administered 2014-06-21 – 2014-06-26 (×10): 40 mg via INTRAVENOUS
  Filled 2014-06-21 (×12): qty 1

## 2014-06-21 MED ORDER — ONDANSETRON HCL 4 MG/2ML IJ SOLN: 4.0000 mg | Freq: Four times a day (QID) | INTRAMUSCULAR | Status: DC | PRN

## 2014-06-21 MED ORDER — MESALAMINE 1000 MG RE SUPP
1000.0000 mg | Freq: Two times a day (BID) | RECTAL | Status: DC
  Filled 2014-06-21 (×2): qty 1

## 2014-06-21 MED ORDER — HYDROMORPHONE HCL 1 MG/ML IJ SOLN
1.0000 mg | INTRAMUSCULAR | Status: DC | PRN
  Administered 2014-06-21 – 2014-06-26 (×17): 1 mg via INTRAVENOUS
  Filled 2014-06-21 (×17): qty 1

## 2014-06-21 MED ORDER — PEG-KCL-NACL-NASULF-NA ASC-C 100 G PO SOLR: 1.0000 | Freq: Once | ORAL | Status: DC

## 2014-06-21 MED ORDER — POTASSIUM CHLORIDE CRYS ER 20 MEQ PO TBCR
40.0000 meq | EXTENDED_RELEASE_TABLET | Freq: Once | ORAL | Status: AC
Start: 2014-06-21 — End: 2014-06-21
  Administered 2014-06-21: 40 meq via ORAL
  Filled 2014-06-21: qty 2

## 2014-06-21 MED ORDER — MESALAMINE 1.2 G PO TBEC
4.8000 g | DELAYED_RELEASE_TABLET | Freq: Every day | ORAL | Status: DC
  Administered 2014-06-22 – 2014-06-25 (×4): 4.8 g via ORAL
  Filled 2014-06-21 (×5): qty 4

## 2014-06-21 MED ORDER — PEG-KCL-NACL-NASULF-NA ASC-C 100 G PO SOLR
0.5000 | Freq: Once | ORAL | Status: AC
  Administered 2014-06-21: 100 g via ORAL
  Filled 2014-06-21: qty 1

## 2014-06-21 MED ORDER — PEG-KCL-NACL-NASULF-NA ASC-C 100 G PO SOLR
0.5000 | Freq: Once | ORAL | Status: AC
  Administered 2014-06-22: 100 g via ORAL

## 2014-06-21 MED ORDER — SODIUM CHLORIDE 0.9 % IV SOLN
Freq: Once | INTRAVENOUS | Status: AC
  Administered 2014-06-21: 01:00:00 via INTRAVENOUS

## 2014-06-21 MED ORDER — ACETAMINOPHEN 650 MG RE SUPP: 650.0000 mg | Freq: Four times a day (QID) | RECTAL | Status: DC | PRN

## 2014-06-21 MED ORDER — SODIUM CHLORIDE 0.9 % IV SOLN: Freq: Once | INTRAVENOUS | Status: DC

## 2014-06-21 MED ORDER — MORPHINE SULFATE 4 MG/ML IJ SOLN
4.0000 mg | INTRAMUSCULAR | Status: AC | PRN
  Administered 2014-06-21 (×3): 4 mg via INTRAVENOUS
  Filled 2014-06-21 (×3): qty 1

## 2014-06-21 MED ORDER — SODIUM CHLORIDE 0.9 % IV SOLN
INTRAVENOUS | Status: DC
  Administered 2014-06-21 – 2014-06-25 (×3): via INTRAVENOUS

## 2014-06-21 NOTE — Progress Notes (Signed)
CARE MANAGEMENT NOTE 06/21/2014  Patient:  Keith Jensen, Keith Jensen   Account Number:  [id number]  Date Initiated:  06/21/2014  Documentation initiated by:  Edwyna Shell  Subjective/Objective Assessment:   38 yo male admiited with acute exacerbation of ulcerative colitis     Action/Plan:   discharge planning   Anticipated DC Date:  06/23/2014   Anticipated DC Plan:  [place]  CM consult      Choice offered to / List presented to:             Status of service:  Completed, signed off Medicare Important Message given?   (If response is "NO", the following Medicare IM given date fields will be blank) Date Medicare IM given:   Medicare IM given by:   Date Additional Medicare IM given:   Additional Medicare IM given by:    Discharge Disposition:  HOME/SELF CARE  Per UR Regulation:    If discussed at [place] Length of Stay Meetings, dates discussed:    Comments:  06/21/14 Edwyna Shell RN, BSN, [place] [id number] Patient stated that he lives at home, has PCP with [facility] and uses [facility] and has no needs at this time.

## 2014-06-21 NOTE — H&P (Signed)
PCP:  Tammi Sou, MD  Justin Mend  Chief Complaint:  Abdominal pain  HPI: Keith Jensen is a 38 y.o. male   has a past medical history of Proctitis (dx'd in early 35s); Elevated blood pressure reading without diagnosis of hypertension; UC (ulcerative colitis); and Ulcerative colitis.   Presented with  Patient has been diagnosed with ulcerative colitis over 10 year ago. For the past 1 month he has been having worsening abdominal pain and bloody bowel movement with increased urgency to defecate. He has been on oral mesalamine as well as rectal without improvement but states have been using rectal suppositories only once a day.  He has been having up to 10 BM a day. He has been started on prednisone taper and is currently on 60 mg PO qday will go down to 50 today.    Hospitalist was called for admission for Ulcerative colitis exacerbation  Review of Systems:    Pertinent positives include: bloody BM's abdominal pain, diarrhea,   Constitutional:  No weight loss, night sweats, Fevers, chills, fatigue, weight loss  HEENT:  No headaches, Difficulty swallowing,Tooth/dental problems,Sore throat,  No sneezing, itching, ear ache, nasal congestion, post nasal drip,  Cardio-vascular:  No chest pain, Orthopnea, PND, anasarca, dizziness, palpitations.no Bilateral lower extremity swelling  GI:  No heartburn, indigestion,  nausea, vomiting,change in bowel habits, loss of appetite, melena, blood in stool, hematemesis Resp:  no shortness of breath at rest. No dyspnea on exertion, No excess mucus, no productive cough, No non-productive cough, No coughing up of blood.No change in color of mucus.No wheezing. Skin:  no rash or lesions. No jaundice GU:  no dysuria, change in color of urine, no urgency or frequency. No straining to urinate.  No flank pain.  Musculoskeletal:  No joint pain or no joint swelling. No decreased range of motion. No back pain.  Psych:  No change in mood or affect. No  depression or anxiety. No memory loss.  Neuro: no localizing neurological complaints, no tingling, no weakness, no double vision, no gait abnormality, no slurred speech, no confusion  Otherwise ROS are negative except for above, 10 systems were reviewed  Past Medical History: Past Medical History  Diagnosis Date  . 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  [facility] notes state that since 2001 he had multiple episodes of proctitis, proctoscopy done 2005 but biopsies not performed, then pt referred to GI and diagnostic colonoscopy was recommended but pt failed to schedule this b/c his bleeding resolved (most recent GI visit as per their old notes is 01/18/2012, with Dr. Wynetta Emery)  . Elevated blood pressure reading without diagnosis of hypertension   . UC (ulcerative colitis)   . Ulcerative colitis    Past Surgical History  Procedure Laterality Date  . Neg hx       Medications: Prior to Admission medications   Medication Sig Start Date End Date Taking? Authorizing Provider  mesalamine (CANASA) 1000 MG suppository Place 1 suppository (1,000 mg total) rectally 2 (two) times daily. 06/13/14  Yes Lori P Hvozdovic, PA-C  mesalamine (LIALDA) 1.2 G EC tablet Take 2 tablets (2.4 g total) by mouth daily with breakfast. 06/13/14  Yes Lori P Hvozdovic, PA-C  predniSONE (DELTASONE) 10 MG tablet Take 60 mg by mouth for 7 days, then 50 mg for 7 days, then 40 mg for 7 days, then 30 mg for 7 days, then 20 mg for 7 days, then 10 mg  for 7 days then discontinue 06/13/14  Yes Lori P Hvozdovic, PA-C  MOVIPREP 100 G SOLR Take 1 kit (200 g total) by mouth once. 06/13/14   Lori P Hvozdovic, PA-C    Allergies:  No Known Allergies  Social History:  Ambulatory  independently   Lives at home With family     reports that he has never smoked. He has never used smokeless tobacco. He reports that he drinks alcohol. He reports that he does not use illicit drugs.    Family  History: family history includes Colon polyps in his father; Heart attack in his mother. There is no history of Colon cancer.    Physical Exam: Patient Vitals for the past 24 hrs:  BP Temp Temp src Pulse Resp SpO2  06/21/14 0010 136/83 mmHg 98.7 F (37.1 C) Oral 82 18 99 %  06/20/14 2256 130/76 mmHg - - 75 18 100 %  06/20/14 2051 138/82 mmHg 98.6 F (37 C) Oral 61 16 98 %  06/20/14 1813 145/95 mmHg 98.6 F (37 C) Oral 80 16 100 %    1. General:  in No Acute distress 2. Psychological: Alert and   Oriented 3. Head/ENT:   Moist Mucous Membranes                          Head Non traumatic, neck supple                          Normal   Dentition 4. SKIN: decreased Skin turgor,  Skin clean Dry and intact no rash 5. Heart: Regular rate and rhythm no Murmur, Rub or gallop 6. Lungs: Clear to auscultation bilaterally, no wheezes or crackles   7. Abdomen: Soft, non- tender, Non distended 8. Lower extremities: no clubbing, cyanosis, or edema 9. Neurologically Grossly intact, moving all 4 extremities equally 10. MSK: Normal range of motion  body mass index is unknown because there is no weight on file.   Labs on Admission:   Results for orders placed or performed during the hospital encounter of 06/20/14 (from the past 24 hour(s))  CBC with Differential     Status: Abnormal   Collection Time: 06/20/14  6:37 PM  Result Value Ref Range   WBC 15.7 (H) 4.0 - 10.5 K/uL   RBC 4.33 4.22 - 5.81 MIL/uL   Hemoglobin 13.0 13.0 - 17.0 g/dL   HCT 38.6 (L) 39.0 - 52.0 %   MCV 89.1 78.0 - 100.0 fL   MCH 30.0 26.0 - 34.0 pg   MCHC 33.7 30.0 - 36.0 g/dL   RDW 12.8 11.5 - 15.5 %   Platelets 403 (H) 150 - 400 K/uL   Neutrophils Relative % 74 43 - 77 %   Neutro Abs 11.6 (H) 1.7 - 7.7 K/uL   Lymphocytes Relative 8 (L) 12 - 46 %   Lymphs Abs 1.3 0.7 - 4.0 K/uL   Monocytes Relative 18 (H) 3 - 12 %   Monocytes Absolute 2.8 (H) 0.1 - 1.0 K/uL   Eosinophils Relative 0 0 - 5 %   Eosinophils Absolute  0.0 0.0 - 0.7 K/uL   Basophils Relative 0 0 - 1 %   Basophils Absolute 0.0 0.0 - 0.1 K/uL  Comprehensive metabolic panel     Status: Abnormal   Collection Time: 06/20/14  6:37 PM  Result Value Ref Range   Sodium 137 137 - 147 mEq/L   Potassium 3.4 (  L) 3.7 - 5.3 mEq/L   Chloride 94 (L) 96 - 112 mEq/L   CO2 30 19 - 32 mEq/L   Glucose, Bld 112 (H) 70 - 99 mg/dL   BUN 12 6 - 23 mg/dL   Creatinine, Ser 1.00 0.50 - 1.35 mg/dL   Calcium 8.7 8.4 - 10.5 mg/dL   Total Protein 7.0 6.0 - 8.3 g/dL   Albumin 2.6 (L) 3.5 - 5.2 g/dL   AST 8 0 - 37 U/L   ALT 11 0 - 53 U/L   Alkaline Phosphatase 66 39 - 117 U/L   Total Bilirubin 0.2 (L) 0.3 - 1.2 mg/dL   GFR calc non Af Amer >90 >90 mL/min   GFR calc Af Amer >90 >90 mL/min   Anion gap 13 5 - 15  Lipase, blood     Status: None   Collection Time: 06/20/14  6:37 PM  Result Value Ref Range   Lipase 31 11 - 59 U/L  Sedimentation rate     Status: Abnormal   Collection Time: 06/20/14  6:37 PM  Result Value Ref Range   Sed Rate 60 (H) 0 - 16 mm/hr   No results found for: HGBA1C  Estimated Creatinine Clearance: 100.2 mL/min (by C-G formula based on Cr of 1).  BNP (last 3 results) No results for input(s): PROBNP in the last 8760 hours.  Other results:  There were no vitals filed for this visit.   Cultures: No results found for: SDES, [place], CULT, REPTSTATUS   Radiological Exams on Admission: Ct Abdomen Pelvis W Contrast  06/20/2014   CLINICAL DATA:  History of ulcerative colitis with mid abdominal pain and diarrhea 1 week.  EXAM: CT ABDOMEN AND PELVIS WITH CONTRAST  TECHNIQUE: Multidetector CT imaging of the abdomen and pelvis was performed using the standard protocol following bolus administration of intravenous contrast.  CONTRAST:  129m OMNIPAQUE IOHEXOL 300 MG/ML SOLN, 529mOMNIPAQUE IOHEXOL 300 MG/ML SOLN  COMPARISON:  03/28/2004  FINDINGS: Lung bases are within normal.  Abdominal images demonstrate a normal liver, spleen, pancreas,  gallbladder and adrenal glands. Kidneys are normal in size without hydronephrosis or nephrolithiasis. Vascular structures are within normal. The appendix is normal. Ureters are normal.  There is mild wall thickening throughout the entire colon without significant adjacent inflammatory change. No evidence of obstruction. Findings are compatible patient's history of ulcerative colitis. These findings are new compared to the previous exam. Small bowel is unremarkable.  Pelvic images demonstrate a normal bladder and prostate. Remaining bones and soft tissues are unremarkable.  IMPRESSION: Mild wall thickening throughout the entire colon without significant adjacent mesenteric inflammatory or free fluid. Findings are compatible with patient's known ulcerative colitis likely representing active inflammation.   Electronically Signed   By: DaMarin Olp.D.   On: 06/20/2014 22:55    Chart has been reviewed  Assessment/Plan [age]ear old male with history of ulcerative colitis here with exacerbation with failed outpatient treatment admitted for IV steroids with GI consult to see in the morning  Present on Admission:  . Ulcerative colitis, acute exacerbation failed outpatient treatment - we'll administer IV fluids, pain control, Solu-Medrol 40 mg twice a day. ER physician spoke to [facility] will come to see patient in a.m. . Marland Kitchenypokalemia - will replace  Prophylaxis: SCD, Protonix  CODE STATUS:  FULL CODE   Other plan as per orders.  I have spent a total of 55 min on this admission  Somaly Marteney 06/21/2014, 12:18 AM  [facility]  Pager [phone]  after 2 AM please page floor coverage PA If 7AM-7PM, please contact the day team taking care of the patient  [email or web address]  Password [email or web address]

## 2014-06-21 NOTE — ED Notes (Signed)
Attempted to call report, primary nurse is unable to take report and will call back.

## 2014-06-21 NOTE — Consult Note (Signed)
Referring Provider: No ref. provider found Primary Care Physician:  Tammi Sou, MD Primary Gastroenterologist:  Dr. Fuller Plan  Reason for Consultation:  UC flare  HPI: Keith Jensen is a 38 y.o. male who was recently seen in our office to establish ongoing care for his history of UC.  He saw one of our PA's, Cecille Rubin, on 11/4.  Per her note,  "Patient states that he was diagnosed with ulcerative active colitis 15 years ago. He was primarily managed by his primary care provider, Dr. Inda Merlin, at the time. He reports that he has seen a gastroenterologist at [facility] a few times, however, he doesn't remember the name of the gastroenterologist and was only seen there for a short period of time due to insurance reasons.  He also saw a gastroenterologist in [facility] but he does not know the physician's name and says he does not remember where the office is. He has never been on oral medications for his ulcerative colitis (except for prednisone). His last flare was in 2012 (about 3 years ago he says). He last had a flexible sigmoidoscopy 5 or 6 years ago. 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  (Never had a full colonoscopy).  On October 5 of this year he started to have lower abdominal cramping, bloody diarrhea, and tenesmus with mucous. He was evaluated at Dr. Rulon Sera office and started on prednisone. While on 40 mg of prednisone he felt well but once he got below 40 mg his diarrhea got worse again. He is currently having 10-12 watery bowel movements with blood and urgency daily. He does have nocturnal stooling. He has constant tenesmus. He had been using ibuprofen intermittently through September and early October for joint pain but discontinued this when his diarrhea started. He has no nausea or vomiting. He states he is afraid to eat because everything runs right through him. 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  He was started on Lialda 2.4 grams daily and Canasa suppositories twice a day.  GI pathogen panel was ordered and we was scheduled for colonoscopy as well.  In the interim his symptoms worsened.  His wife called our office on 11/9 stating that he was having a lot of abdominal cramping and passing blood several times without stool despite the medication changes.  He was directed to come to [facility] ED.  He did not present to the ED until 11/11, however.  Upon evaluation CT scan of the abdomen and pelvis with contrast showed the following:  "IMPRESSION: Mild wall thickening throughout the entire colon without significant adjacent mesenteric inflammatory or free fluid. Findings are compatible with patient's known ulcerative colitis likely representing active inflammation."  CBC reveals a drop in Hgb from 15.8 grams one month ago to 11.1 grams today.  He had a leukocytosis on presentation yesterday of 15.7, however, that has normalized today.  Sed rate is elevated at 60.  He has been placed on IVF's, NPO, on solumedrol 40 mg BID.  Lialda 2.4 grams and Canasa suppositories BID have been continued for now.  He tells me that he's only had one liquid/watery BM since admission without blood.  So far only received one dose of IV steroids.  Says that the Canasa burns when he uses it.  Says that  in the past his flares usually lasted 8 weeks even with prednisone treatment.   Past Medical History  Diagnosis Date  . 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  [facility] notes state that since 2001 he had multiple episodes of proctitis, proctoscopy done 2005 but biopsies not performed, then pt referred to GI and diagnostic colonoscopy was recommended but pt failed to schedule this b/c his bleeding resolved (most recent GI visit as per their old notes is 01/18/2012, with Dr. Wynetta Emery)  . Elevated  blood pressure reading without diagnosis of hypertension   . UC (ulcerative colitis)   . Ulcerative colitis     Past Surgical History  Procedure Laterality Date  . Neg hx      Prior to Admission medications   Medication Sig Start Date End Date Taking? Authorizing Provider  mesalamine (CANASA) 1000 MG suppository Place 1 suppository (1,000 mg total) rectally 2 (two) times daily. 06/13/14  Yes Lori P Hvozdovic, PA-C  mesalamine (LIALDA) 1.2 G EC tablet Take 2 tablets (2.4 g total) by mouth daily with breakfast. 06/13/14  Yes Lori P Hvozdovic, PA-C  predniSONE (DELTASONE) 10 MG tablet Take 60 mg by mouth for 7 days, then 50 mg for 7 days, then 40 mg for 7 days, then 30 mg for 7 days, then 20 mg for 7 days, then 10 mg for 7 days then discontinue 06/13/14  Yes Lori P Hvozdovic, PA-C  MOVIPREP 100 G SOLR Take 1 kit (200 g total) by mouth once. 06/13/14   Lori P Hvozdovic, PA-C    Current Facility-Administered Medications  Medication Dose Route Frequency Provider Last Rate Last Dose  . acetaminophen (TYLENOL) tablet 650 mg  650 mg Oral Q6H PRN Toy Baker, MD       Or  . acetaminophen (TYLENOL) suppository 650 mg  650 mg Rectal Q6H PRN Toy Baker, MD      . HYDROcodone-acetaminophen (NORCO/VICODIN) 5-325 MG per tablet 1-2 tablet  1-2 tablet Oral Q4H PRN Toy Baker, MD   2 tablet at 06/21/14 0912  . [START ON 06/22/2014] mesalamine (LIALDA) EC tablet 4.8 g  4.8 g Oral Q breakfast Jessica D. Zehr, PA-C      . methylPREDNISolone sodium succinate (SOLU-MEDROL) 40 mg/mL injection 40 mg  40 mg Intravenous BID Toy Baker, MD      . morphine 4 MG/ML injection 4 mg  4 mg Intravenous Q3H PRN Toy Baker, MD   4 mg at 06/21/14 0700  . ondansetron (ZOFRAN) tablet 4 mg  4 mg Oral Q6H PRN Toy Baker, MD       Or  . ondansetron (ZOFRAN) injection 4 mg  4 mg Intravenous Q6H PRN Toy Baker, MD        Allergies as of 06/20/2014  . (No Known Allergies)     Family History  Problem Relation Age of Onset  . Heart attack Mother     per pt she had accidental overdose  . Colon cancer Neg Hx   . Colon polyps Father     History   Social History  . Marital Status: Married    Spouse Name: N/A    Number of Children: 0  . Years of Education: N/A   Occupational History  . finance    Social History Main Topics  . Smoking status: Never Smoker   . Smokeless tobacco: Never Used  . Alcohol Use: Yes     Comment: 2-3 times weekly  .  Drug Use: No  . Sexual Activity: Not on file   Other Topics Concern  . Not on file   Social History Narrative   Married, no children.  Father healthy, sister healthy.  Mom d. Accidental overdose.   Education: Scientist, product/process development.   Occupation: Engineer, mining.   No tobacco.  Beer 2-3 times per week.  No drugs.   Exercises: lifting wt's and biking.    Review of Systems: Ten point ROS is O/W negative except as mentioned in HPI  Physical Exam: Vital signs in last 24 hours: Temp:  [98.3 F (36.8 C)-98.7 F (37.1 C)] 98.7 F (37.1 C) (11/12 0534) Pulse Rate:  [54-82] 54 (11/12 0534) Resp:  [16-20] 20 (11/12 0534) BP: (117-145)/(76-95) 117/77 mmHg (11/12 0534) SpO2:  [98 %-100 %] 98 % (11/12 0534) Weight:  [179 lb 6.4 oz (81.375 kg)] 179 lb 6.4 oz (81.375 kg) (11/12 0049) Last BM Date: 06/20/14 General:  Alert, Well-developed, well-nourished, pleasant and cooperative in NAD Head:  Normocephalic and atraumatic. Eyes:  Sclera clear, no icterus.  Conjunctiva pink. Ears:  Normal auditory acuity. Mouth:  No deformity or lesions.   Lungs:  Clear throughout to auscultation.  No wheezes, crackles, or rhonchi.  Heart:  Bradycardic, but regular rhythm; no murmurs, clicks, rubs, or gallops. Abdomen:  Soft,non-distended.  BS present.  Non-tender. Rectal:  Deferred  Msk:  Symmetrical without gross deformities. Pulses:  Normal pulses noted. Extremities:  Without clubbing or edema. Neurologic:  Alert and  oriented  x4;  grossly normal neurologically. Skin:  Intact without significant lesions or rashes Psych:  Alert and cooperative. Normal mood and affect.  Intake/Output from previous day: 11/11 0701 - 11/12 0700 In: 622.9 [I.V.:622.9] Out: -    Lab Results:  Recent Labs  06/20/14 1837 06/21/14 0424  WBC 15.7* 10.1  HGB 13.0 11.1*  HCT 38.6* 34.3*  PLT 403* 351   BMET  Recent Labs  06/20/14 1837 06/21/14 0424  NA 137 134*  K 3.4* 3.8  CL 94* 97  CO2 30 29  GLUCOSE 112* 122*  BUN 12 9  CREATININE 1.00 0.92  CALCIUM 8.7 8.1*   LFT  Recent Labs  06/21/14 0424  PROT 5.6*  ALBUMIN 2.1*  AST 6  ALT 7  ALKPHOS 53  BILITOT <0.2*   Studies/Results: Ct Abdomen Pelvis W Contrast  06/20/2014   CLINICAL DATA:  History of ulcerative colitis with mid abdominal pain and diarrhea 1 week.  EXAM: CT ABDOMEN AND PELVIS WITH CONTRAST  TECHNIQUE: Multidetector CT imaging of the abdomen and pelvis was performed using the standard protocol following bolus administration of intravenous contrast.  CONTRAST:  131m OMNIPAQUE IOHEXOL 300 MG/ML SOLN, 594mOMNIPAQUE IOHEXOL 300 MG/ML SOLN  COMPARISON:  03/28/2004  FINDINGS: Lung bases are within normal.  Abdominal images demonstrate a normal liver, spleen, pancreas, gallbladder and adrenal glands. Kidneys are normal in size without hydronephrosis or nephrolithiasis. Vascular structures are within normal. The appendix is normal. Ureters are normal.  There is mild wall thickening throughout the entire colon without significant adjacent inflammatory change. No evidence of obstruction. Findings are compatible patient's history of ulcerative colitis. These findings are new compared to the previous exam. Small bowel is unremarkable.  Pelvic images demonstrate a normal bladder and prostate. Remaining bones and soft tissues are unremarkable.  IMPRESSION: Mild wall thickening throughout the entire colon without significant adjacent mesenteric inflammatory or free  fluid. Findings are compatible with patient's known ulcerative colitis likely representing active inflammation.   Electronically Signed  By: Marin Olp M.D.   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  PLAN: *Agree with IVF's and IV steroids (solumedrol 40 mg IV BID).  May be able to give him clear liquids. *Will increase Lialda to 4.8 grams daily.  Will discontinue canasa suppositories since he says that they are burning when he uses them. *Check stool GI pathogen panel. *Will likely plan for inpatient colonoscopy to assess extent/severity of disease. *Will check quantiferon gold, Hep BsAg and Hep BsAb, and TPMT levels in preparation for possible need for escalation of therapy in the future.  ZEHR, JESSICA D.  06/21/2014, 10:16 AM  Pager number [phone]  ________________________________________________________________________  Velora Heckler GI MD note:  I personally examined the patient, reviewed the data and agree with the assessment and plan described above.  He has already noticed improvement since starting IV steroids (bleeding stopped, only one loose stool since admit).  Planning for colonoscopy tomorrow.  He understands he will likely need maint therapy (mesalamine or stronger), Juste term.     Owens Loffler, MD [facility] Pager [phone]

## 2014-06-21 NOTE — Progress Notes (Signed)
Pt admitted after midnight. Please see earlier note by Dr. Roel Cluck. Pt seen and examined at bedside. VSS. GI consulted, appreciate assistance. Will follow up on recommendations.   Faye Ramsay, MD  [facility] Pager [phone] Cell [phone]  If 7PM-7AM, please contact night-coverage [email or web address] Password [email or web address]

## 2014-06-22 ENCOUNTER — Encounter (HOSPITAL_COMMUNITY)
Admission: EM | Disposition: A | Discharge status: Home / Self Care | Payer: Self-pay | Source: Home / Self Care | Attending: Internal Medicine

## 2014-06-22 ENCOUNTER — Encounter (HOSPITAL_COMMUNITY): Payer: Self-pay

## 2014-06-22 DIAGNOSIS — K5190 Ulcerative colitis, unspecified, without complications: Principal | ICD-10-CM

## 2014-06-22 HISTORY — PX: COLONOSCOPY: SHX5424

## 2014-06-22 LAB — GI PATHOGEN PANEL BY PCR, STOOL
C difficile toxin A/B: NEGATIVE
Campylobacter by PCR: NEGATIVE
Cryptosporidium by PCR: NEGATIVE
E COLI (ETEC) LT/ST: NEGATIVE
E COLI (STEC): NEGATIVE
E coli 0157 by PCR: NEGATIVE
G lamblia by PCR: NEGATIVE
NOROVIRUS G1/G2: NEGATIVE
Rotavirus A by PCR: NEGATIVE
SALMONELLA BY PCR: NEGATIVE
SHIGELLA BY PCR: NEGATIVE

## 2014-06-22 LAB — CBC
HEMATOCRIT: 38.6 % — AB (ref 39.0–52.0)
Hemoglobin: 12.8 g/dL — ABNORMAL LOW (ref 13.0–17.0)
MCH: 29.6 pg (ref 26.0–34.0)
MCHC: 33.2 g/dL (ref 30.0–36.0)
MCV: 89.4 fL (ref 78.0–100.0)
PLATELETS: 395 10*3/uL (ref 150–400)
RBC: 4.32 MIL/uL (ref 4.22–5.81)
RDW: 12.9 % (ref 11.5–15.5)
WBC: 15.7 10*3/uL — AB (ref 4.0–10.5)

## 2014-06-22 LAB — COMPREHENSIVE METABOLIC PANEL
ALT: 8 U/L (ref 0–53)
AST: 7 U/L (ref 0–37)
Albumin: 2.4 g/dL — ABNORMAL LOW (ref 3.5–5.2)
Alkaline Phosphatase: 66 U/L (ref 39–117)
Anion gap: 13 (ref 5–15)
BUN: 8 mg/dL (ref 6–23)
CALCIUM: 8.5 mg/dL (ref 8.4–10.5)
CHLORIDE: 94 meq/L — AB (ref 96–112)
CO2: 29 meq/L (ref 19–32)
Creatinine, Ser: 0.81 mg/dL (ref 0.50–1.35)
GLUCOSE: 144 mg/dL — AB (ref 70–99)
POTASSIUM: 4.2 meq/L (ref 3.7–5.3)
Sodium: 136 mEq/L — ABNORMAL LOW (ref 137–147)
Total Bilirubin: 0.3 mg/dL (ref 0.3–1.2)
Total Protein: 6.9 g/dL (ref 6.0–8.3)

## 2014-06-22 SURGERY — COLONOSCOPY
Anesthesia: Moderate Sedation

## 2014-06-22 MED ORDER — FENTANYL CITRATE 0.05 MG/ML IJ SOLN
INTRAMUSCULAR | Status: AC
  Filled 2014-06-22: qty 4

## 2014-06-22 MED ORDER — MIDAZOLAM HCL 10 MG/2ML IJ SOLN
INTRAMUSCULAR | Status: AC
  Filled 2014-06-22: qty 2

## 2014-06-22 MED ORDER — SODIUM CHLORIDE 0.9 % IV SOLN: Freq: Once | INTRAVENOUS | Status: DC

## 2014-06-22 MED ORDER — SODIUM CHLORIDE 0.9 % IV SOLN: INTRAVENOUS | Status: DC

## 2014-06-22 MED ORDER — FENTANYL CITRATE 0.05 MG/ML IJ SOLN
INTRAMUSCULAR | Status: DC | PRN
  Administered 2014-06-22 (×4): 25 ug via INTRAVENOUS

## 2014-06-22 MED ORDER — DIPHENHYDRAMINE HCL 50 MG/ML IJ SOLN
INTRAMUSCULAR | Status: AC
  Filled 2014-06-22: qty 1

## 2014-06-22 MED ORDER — MIDAZOLAM HCL 5 MG/5ML IJ SOLN
INTRAMUSCULAR | Status: DC | PRN
  Administered 2014-06-22 (×4): 2 mg via INTRAVENOUS

## 2014-06-22 NOTE — Op Note (Addendum)
[facility] [place], [id number]   COLONOSCOPY PROCEDURE REPORT  PATIENT: Keith, Jensen  MR#: [id number] BIRTHDATE: Apr 16, 1976 , 63  yrs. old GENDER: male ENDOSCOPIST: Milus Banister, MD PROCEDURE DATE:  06/22/2014 PROCEDURE:   Colonoscopy with biopsy First Screening Colonoscopy - Avg.  risk and is 50 yrs.  old or older - No.  Prior Negative Screening - Now for repeat screening. N/A  History of Adenoma - Now for follow-up colonoscopy & has been > or = to 3 yrs.  N/A  Polyps Removed Today? No.  Recommend repeat exam, <10 yrs? No. ASA CLASS:   Class II INDICATIONS:chronic UC; now with flare and recent CT scan showed pan-colitis changes. MEDICATIONS: Fentanyl 100 mcg IV and Versed 8 mg IV  DESCRIPTION OF PROCEDURE:   After the risks benefits and alternatives of the procedure were thoroughly explained, informed consent was obtained.  The digital rectal exam revealed no abnormalities of the rectum.   The Pentax Ped Colon [id number] endoscope was introduced through the anus and advanced to the splenic flexure. No adverse events experienced.   The quality of the prep was good.  The instrument was then slowly withdrawn as the colon was fully examined.   COLON FINDINGS: There was severe inflammation from the distal rectum to the extent of the exam (splenic flexure).  The mucosa was severely inflamed and there were mutliple ulcers scattered throughout the visualized colon.  There was no overt signs of C. diff.  I was concerned about increased risk of perforation and terminated the exam after reaching about the splenic flexure at a spot that was going to require additional pressure to advance through.  Retroflexion was not performed due to a narrow rectal vault. The time to cecum=  .  Withdrawal time=  .  The scope was withdrawn and the procedure completed. COMPLICATIONS: There were no immediate complications.  ENDOSCOPIC IMPRESSION: There was severe  inflammation from the distal rectum to the extent of the exam (splenic flexure).  The mucosa was severely inflamed and there were mutliple ulcers scattered throughout the visualized colon.  There was no overt signs of C.  diff.  I was concerned about increased risk of perforation and terminated the exam after reaching about the splenic flexure at a spot that was going to require additional pressure to advance through  RECOMMENDATIONS: Continue IV steroids (solumedrol 22m twice daily) and full dose mesalamine (lialda 4.8grms) for now.  Lab testing sent (TPMT enzyme evaluation, TB testing, Hep B testing) so that either immunomodators or biologics could be started if needed.  eSigned:  DMilus Banister MD 06/22/2014 9:24 AM Revised: 06/22/2014 9:24 AM  cc: MLucio Edward MD

## 2014-06-22 NOTE — Interval H&P Note (Signed)
History and Physical Interval Note:  06/22/2014 8:35 AM  Keith Jensen  has presented today for surgery, with the diagnosis of Ulcerative colitis with flare  The various methods of treatment have been discussed with the patient and family. After consideration of risks, benefits and other options for treatment, the patient has consented to  Procedure(s): COLONOSCOPY (N/A) as a surgical intervention .  The patient's history has been reviewed, patient examined, no change in status, stable for surgery.  I have reviewed the patient's chart and labs.  Questions were answered to the patient's satisfaction.     Milus Banister

## 2014-06-22 NOTE — H&P (View-Only) (Signed)
 Referring Provider: No ref. provider found Primary Care Physician:  MCGOWEN,PHILIP H, MD Primary Gastroenterologist:  Dr. Stark  Reason for Consultation:  UC flare  HPI: Keith Jensen is a 38 y.o. male who was recently seen in our office to establish ongoing care for his history of UC.  He saw one of our PA's, Lori, on 11/4.  Per her note,  "Patient states that he was diagnosed with ulcerative active colitis 15 years ago. He was primarily managed by his primary care provider, Dr. Gates, at the time. He reports that he has seen a gastroenterologist at [facility] a few times, however, he doesn't remember the name of the gastroenterologist and was only seen there for a short period of time due to insurance reasons.  He also saw a gastroenterologist in [place] but he does not know the physician's name and says he does not remember where the office is. He has never been on oral medications for his ulcerative colitis (except for prednisone). His last flare was in 2012 (about 3 years ago he says). He last had a flexible sigmoidoscopy 5 or 6 years ago. 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  (Never had a full colonoscopy).  On October 5 of this year he started to have lower abdominal cramping, bloody diarrhea, and tenesmus with mucous. He was evaluated at Dr. McGowan's office and started on prednisone. While on 40 mg of prednisone he felt well but once he got below 40 mg his diarrhea got worse again. He is currently having 10-12 watery bowel movements with blood and urgency daily. He does have nocturnal stooling. He has constant tenesmus. He had been using ibuprofen intermittently through September and early October for joint pain but discontinued this when his diarrhea started. He has no nausea or vomiting. He states he is afraid to eat because everything runs right through him. 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  He was started on Lialda 2.4 grams daily and Canasa suppositories twice a day.  GI pathogen panel was ordered and we was scheduled for colonoscopy as well.  In the interim his symptoms worsened.  His wife called our office on 11/9 stating that he was having a lot of abdominal cramping and passing blood several times without stool despite the medication changes.  He was directed to come to [facility] ED.  He did not present to the ED until 11/11, however.  Upon evaluation CT scan of the abdomen and pelvis with contrast showed the following:  "IMPRESSION: Mild wall thickening throughout the entire colon without significant adjacent mesenteric inflammatory or free fluid. Findings are compatible with patient's known ulcerative colitis likely representing active inflammation."  CBC reveals a drop in Hgb from 15.8 grams one month ago to 11.1 grams today.  He had a leukocytosis on presentation yesterday of 15.7, however, that has normalized today.  Sed rate is elevated at 60.  He has been placed on IVF's, NPO, on solumedrol 40 mg BID.  Lialda 2.4 grams and Canasa suppositories BID have been continued for now.  He tells me that he's only had one liquid/watery BM since admission without blood.  So far only received one dose of IV steroids.  Says that the Canasa burns when he uses it.  Says that   in the past his flares usually lasted 8 weeks even with prednisone treatment.   Past Medical History  Diagnosis Date  . 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  [facility] notes state that since 2001 he had multiple episodes of proctitis, proctoscopy done 2005 but biopsies not performed, then pt referred to GI and diagnostic colonoscopy was recommended but pt failed to schedule this b/c his bleeding resolved (most recent GI visit as per their old notes is 01/18/2012, with Dr. Johnson)  . Elevated  blood pressure reading without diagnosis of hypertension   . UC (ulcerative colitis)   . Ulcerative colitis     Past Surgical History  Procedure Laterality Date  . Neg hx      Prior to Admission medications   Medication Sig Start Date End Date Taking? Authorizing Provider  mesalamine (CANASA) 1000 MG suppository Place 1 suppository (1,000 mg total) rectally 2 (two) times daily. 06/13/14  Yes Lori P Hvozdovic, PA-C  mesalamine (LIALDA) 1.2 G EC tablet Take 2 tablets (2.4 g total) by mouth daily with breakfast. 06/13/14  Yes Lori P Hvozdovic, PA-C  predniSONE (DELTASONE) 10 MG tablet Take 60 mg by mouth for 7 days, then 50 mg for 7 days, then 40 mg for 7 days, then 30 mg for 7 days, then 20 mg for 7 days, then 10 mg for 7 days then discontinue 06/13/14  Yes Lori P Hvozdovic, PA-C  MOVIPREP 100 G SOLR Take 1 kit (200 g total) by mouth once. 06/13/14   Lori P Hvozdovic, PA-C    Current Facility-Administered Medications  Medication Dose Route Frequency Provider Last Rate Last Dose  . acetaminophen (TYLENOL) tablet 650 mg  650 mg Oral Q6H PRN Anastassia Doutova, MD       Or  . acetaminophen (TYLENOL) suppository 650 mg  650 mg Rectal Q6H PRN Anastassia Doutova, MD      . HYDROcodone-acetaminophen (NORCO/VICODIN) 5-325 MG per tablet 1-2 tablet  1-2 tablet Oral Q4H PRN Anastassia Doutova, MD   2 tablet at 06/21/14 0912  . [START ON 06/22/2014] mesalamine (LIALDA) EC tablet 4.8 g  4.8 g Oral Q breakfast Jessica D. Zehr, PA-C      . methylPREDNISolone sodium succinate (SOLU-MEDROL) 40 mg/mL injection 40 mg  40 mg Intravenous BID Anastassia Doutova, MD      . morphine 4 MG/ML injection 4 mg  4 mg Intravenous Q3H PRN Anastassia Doutova, MD   4 mg at 06/21/14 0700  . ondansetron (ZOFRAN) tablet 4 mg  4 mg Oral Q6H PRN Anastassia Doutova, MD       Or  . ondansetron (ZOFRAN) injection 4 mg  4 mg Intravenous Q6H PRN Anastassia Doutova, MD        Allergies as of 06/20/2014  . (No Known Allergies)     Family History  Problem Relation Age of Onset  . Heart attack Mother     per pt she had accidental overdose  . Colon cancer Neg Hx   . Colon polyps Father     History   Social History  . Marital Status: Married    Spouse Name: N/A    Number of Children: 0  . Years of Education: N/A   Occupational History  . finance    Social History Main Topics  . Smoking status: Never Smoker   . Smokeless tobacco: Never Used  . Alcohol Use: Yes     Comment: 2-3 times weekly  .   Drug Use: No  . Sexual Activity: Not on file   Other Topics Concern  . Not on file   Social History Narrative   Married, no children.  Father healthy, sister healthy.  Mom d. Accidental overdose.   Education: Bachelor's degree-[employer].   Occupation: Finance.   No tobacco.  Beer 2-3 times per week.  No drugs.   Exercises: lifting wt's and biking.    Review of Systems: Ten point ROS is O/W negative except as mentioned in HPI  Physical Exam: Vital signs in last 24 hours: Temp:  [98.3 F (36.8 C)-98.7 F (37.1 C)] 98.7 F (37.1 C) (11/12 0534) Pulse Rate:  [54-82] 54 (11/12 0534) Resp:  [16-20] 20 (11/12 0534) BP: (117-145)/(76-95) 117/77 mmHg (11/12 0534) SpO2:  [98 %-100 %] 98 % (11/12 0534) Weight:  [179 lb 6.4 oz (81.375 kg)] 179 lb 6.4 oz (81.375 kg) (11/12 0049) Last BM Date: 06/20/14 General:  Alert, Well-developed, well-nourished, pleasant and cooperative in NAD Head:  Normocephalic and atraumatic. Eyes:  Sclera clear, no icterus.  Conjunctiva pink. Ears:  Normal auditory acuity. Mouth:  No deformity or lesions.   Lungs:  Clear throughout to auscultation.  No wheezes, crackles, or rhonchi.  Heart:  Bradycardic, but regular rhythm; no murmurs, clicks, rubs, or gallops. Abdomen:  Soft,non-distended.  BS present.  Non-tender. Rectal:  Deferred  Msk:  Symmetrical without gross deformities. Pulses:  Normal pulses noted. Extremities:  Without clubbing or edema. Neurologic:  Alert and  oriented  x4;  grossly normal neurologically. Skin:  Intact without significant lesions or rashes Psych:  Alert and cooperative. Normal mood and affect.  Intake/Output from previous day: 11/11 0701 - 11/12 0700 In: 622.9 [I.V.:622.9] Out: -    Lab Results:  Recent Labs  06/20/14 1837 06/21/14 0424  WBC 15.7* 10.1  HGB 13.0 11.1*  HCT 38.6* 34.3*  PLT 403* 351   BMET  Recent Labs  06/20/14 1837 06/21/14 0424  NA 137 134*  K 3.4* 3.8  CL 94* 97  CO2 30 29  GLUCOSE 112* 122*  BUN 12 9  CREATININE 1.00 0.92  CALCIUM 8.7 8.1*   LFT  Recent Labs  06/21/14 0424  PROT 5.6*  ALBUMIN 2.1*  AST 6  ALT 7  ALKPHOS 53  BILITOT <0.2*   Studies/Results: Ct Abdomen Pelvis W Contrast  06/20/2014   CLINICAL DATA:  History of ulcerative colitis with mid abdominal pain and diarrhea 1 week.  EXAM: CT ABDOMEN AND PELVIS WITH CONTRAST  TECHNIQUE: Multidetector CT imaging of the abdomen and pelvis was performed using the standard protocol following bolus administration of intravenous contrast.  CONTRAST:  100mL OMNIPAQUE IOHEXOL 300 MG/ML SOLN, 50mL OMNIPAQUE IOHEXOL 300 MG/ML SOLN  COMPARISON:  03/28/2004  FINDINGS: Lung bases are within normal.  Abdominal images demonstrate a normal liver, spleen, pancreas, gallbladder and adrenal glands. Kidneys are normal in size without hydronephrosis or nephrolithiasis. Vascular structures are within normal. The appendix is normal. Ureters are normal.  There is mild wall thickening throughout the entire colon without significant adjacent inflammatory change. No evidence of obstruction. Findings are compatible patient's history of ulcerative colitis. These findings are new compared to the previous exam. Small bowel is unremarkable.  Pelvic images demonstrate a normal bladder and prostate. Remaining bones and soft tissues are unremarkable.  IMPRESSION: Mild wall thickening throughout the entire colon without significant adjacent mesenteric inflammatory or free  fluid. Findings are compatible with patient's known ulcerative colitis likely representing active inflammation.   Electronically Signed     By: Daniel  Boyle M.D.   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  PLAN: *Agree with IVF's and IV steroids (solumedrol 40 mg IV BID).  May be able to give him clear liquids. *Will increase Lialda to 4.8 grams daily.  Will discontinue canasa suppositories since he says that they are burning when he uses them. *Check stool GI pathogen panel. *Will likely plan for inpatient colonoscopy to assess extent/severity of disease. *Will check quantiferon gold, Hep BsAg and Hep BsAb, and TPMT levels in preparation for possible need for escalation of therapy in the future.  ZEHR, JESSICA D.  06/21/2014, 10:16 AM  Pager number [phone]  ________________________________________________________________________  [facility] MD note:  I personally examined the patient, reviewed the data and agree with the assessment and plan described above.  He has already noticed improvement since starting IV steroids (bleeding stopped, only one loose stool since admit).  Planning for colonoscopy tomorrow.  He understands he will likely need maint therapy (mesalamine or stronger), Auker term.     Daniel Jacobs, MD [facility] Pager [phone]     

## 2014-06-22 NOTE — Progress Notes (Signed)
Patient ID: Keith Jensen, male   DOB: February 12, 1976, 38 y.o.   MRN: [id number]  [facility] PROGRESS NOTE  Keith Jensen Keith Jensen:[id number] DOB: Sep 21, 1975 DOA: 06/20/2014 PCP: Tammi Sou, MD  Brief narrative: 38 y.o. Male with UC diagnosed 15 years ago, presented with main concern of several weeks duration of abd cramps, bloody diarrhea, poor oral intake. He was started on Prednisone by PCP and was initially doing well but as he tapered Prednisone down, his diarrhea got worse.   Assessment and Plan:    Active Problems:   Ulcerative colitis - severe inflammation on colonoscopy, results noted below - appreciate GI input - recommendation is to continue IV steroids (solumedrol 23m twice daily) and full dose mesalamine (lialda 4.8grms) for now   Hypokalemia - secondary to diarrhea - supplemented and WNL this AM - repeat BMP In AM   Leukocytosis - likely steroid induced - no sings of an infectious etiology, no indications for ABX - repeat CBC in AM  DVT prophylaxis  SCD's  Code Status: Full Family Communication: Pt and family at bedside Disposition Plan: Home when medically stable  IV Access:   Peripheral IV Procedures and diagnostic studies:   Ct Abdomen Pelvis W Contrast  06/20/2014  Mild wall thickening throughout the entire colon without significant adjacent mesenteric inflammatory or free fluid. Findings are compatible with patient's known ulcerative colitis likely representing active inflammation.  Colonoscopy 06/22/2014 There was severe inflammation from the distal rectum to the extent of the exam (splenic flexure). The mucosa was severely inflamed and there were mutliple ulcers scattered throughout the visualized colon. There was no overt signs of C. diff. Medical Consultants:   GI Other Consultants:   None   Anti-Infectives:   None   MFaye Ramsay MD  [facility] Pager [phone] If 7PM-7AM, please contact night-coverage [email or web address] Password  [facility]11/13/2015, 12:57 PM   LOS: 2 days   HPI/Subjective: No events overnight.   Objective: Filed Vitals:   06/22/14 0955 06/22/14 1000 06/22/14 1005 06/22/14 1010  BP:   161/92   Pulse: 84 92 86 89  Temp:      TempSrc:      Resp: 21 15 20 16   Height:      Weight:      SpO2: 98% 100% 94% 97%    Intake/Output Summary (Last 24 hours) at 06/22/14 1257 Last data filed at 06/22/14 0500  Gross per 24 hour  Intake 1196.25 ml  Output      0 ml  Net 1196.25 ml    Exam:   General:  Pt is alert, follows commands appropriately, not in acute distress  Cardiovascular: Regular rate and rhythm, S1/S2, no murmurs, no rubs, no gallops  Respiratory: Clear to auscultation bilaterally, no wheezing, no crackles, no rhonchi  Abdomen: Soft, non tender, non distended, bowel sounds present, no guarding  Data Reviewed: Basic Metabolic Panel:  Recent Labs Lab 06/20/14 1837 06/21/14 0424 06/22/14 0428  NA 137 134* 136*  K 3.4* 3.8 4.2  CL 94* 97 94*  CO2 30 29 29   GLUCOSE 112* 122* 144*  BUN 12 9 8   CREATININE 1.00 0.92 0.81  CALCIUM 8.7 8.1* 8.5  MG  --  2.2  --   PHOS  --  3.2  --    Liver Function Tests:  Recent Labs Lab 06/20/14 1837 06/21/14 0424 06/22/14 0428  AST 8 6 7   ALT 11 7 8   ALKPHOS 66 53 66  BILITOT 0.2* <0.2* 0.3  PROT 7.0  5.6* 6.9  ALBUMIN 2.6* 2.1* 2.4*    Recent Labs Lab 06/20/14 1837  LIPASE 31   CBC:  Recent Labs Lab 06/20/14 1837 06/21/14 0424 06/22/14 0428  WBC 15.7* 10.1 15.7*  NEUTROABS 11.6*  --   --   HGB 13.0 11.1* 12.8*  HCT 38.6* 34.3* 38.6*  MCV 89.1 90.0 89.4  PLT 403* 351 395   Scheduled Meds: . sodium chloride   Intravenous Once  . mesalamine  4.8 g Oral Q breakfast  . methylPREDNISolone (SOLU-MEDROL) injection  40 mg Intravenous BID   Continuous Infusions: . sodium chloride 75 mL/hr at 06/21/14 1303

## 2014-06-23 LAB — CBC
HCT: 35.5 % — ABNORMAL LOW (ref 39.0–52.0)
Hemoglobin: 11.7 g/dL — ABNORMAL LOW (ref 13.0–17.0)
MCH: 29.6 pg (ref 26.0–34.0)
MCHC: 33 g/dL (ref 30.0–36.0)
MCV: 89.9 fL (ref 78.0–100.0)
Platelets: 357 10*3/uL (ref 150–400)
RBC: 3.95 MIL/uL — ABNORMAL LOW (ref 4.22–5.81)
RDW: 13.1 % (ref 11.5–15.5)
WBC: 8.7 10*3/uL (ref 4.0–10.5)

## 2014-06-23 NOTE — Plan of Care (Signed)
Problem: Phase I Progression Outcomes Goal: Pain controlled with appropriate interventions Outcome: Completed/Met Date Met:  06/23/14 Goal: OOB as tolerated unless otherwise ordered Outcome: Completed/Met Date Met:  06/23/14 Goal: Voiding-avoid urinary catheter unless indicated Outcome: Completed/Met Date Met:  06/23/14     

## 2014-06-23 NOTE — Progress Notes (Signed)
Progress Note for [facility]  Subjective: No acute events.  Only one bowel movement last evening.  He thinks he had 3-4 in a 24 hour period.  ABM pain has markedly improved.  Objective: Vital signs in last 24 hours: Temp:  [97.8 F (36.6 C)-98.7 F (37.1 C)] 97.9 F (36.6 C) (11/14 0546) Pulse Rate:  [75-105] 75 (11/14 0546) Resp:  [13-24] 20 (11/14 0546) BP: (120-181)/(54-112) 122/54 mmHg (11/14 0546) SpO2:  [94 %-100 %] 97 % (11/14 0546) Last BM Date: 06/22/14  Intake/Output from previous day:   Intake/Output this shift:    General appearance: alert and no distress GI: soft, non-tender; bowel sounds normal; no masses,  no organomegaly  Lab Results:  Recent Labs  06/21/14 0424 06/22/14 0428 06/23/14 0530  WBC 10.1 15.7* 8.7  HGB 11.1* 12.8* 11.7*  HCT 34.3* 38.6* 35.5*  PLT 351 395 357   BMET  Recent Labs  06/20/14 1837 06/21/14 0424 06/22/14 0428  NA 137 134* 136*  K 3.4* 3.8 4.2  CL 94* 97 94*  CO2 30 29 29   GLUCOSE 112* 122* 144*  BUN 12 9 8   CREATININE 1.00 0.92 0.81  CALCIUM 8.7 8.1* 8.5   LFT  Recent Labs  06/22/14 0428  PROT 6.9  ALBUMIN 2.4*  AST 7  ALT 8  ALKPHOS 66  BILITOT 0.3   PT/INR No results for input(s): LABPROT, INR in the last 72 hours. Hepatitis Panel  Recent Labs  06/21/14 1031  HEPBSAG NEGATIVE   C-Diff No results for input(s): CDIFFTOX in the last 72 hours. Fecal Lactopherrin No results for input(s): FECLLACTOFRN in the last 72 hours.  Studies/Results: No results found.  Medications:  Scheduled: . sodium chloride   Intravenous Once  . mesalamine  4.8 g Oral Q breakfast  . methylPREDNISolone (SOLU-MEDROL) injection  40 mg Intravenous BID   Continuous: . sodium chloride 75 mL/hr at 06/21/14 1303    Assessment/Plan: 1) Severe UC.   The patient appears to be responsive to IV steroids.  Decrease in bowel movements and a marked decrease in pain.    Plan: 1) Continue with Solumedrol. 2) Advance diet  tomorrow if continues to make clinical progress.  LOS: 3 days   Carles Florea D 06/23/2014, 6:04 AM

## 2014-06-23 NOTE — Progress Notes (Signed)
Patient ID: Keith Jensen, male   DOB: 09-09-1975, 38 y.o.   MRN: [id number]  [facility] PROGRESS NOTE  Keith Jensen VBT:[id number] DOB: 03-29-1976 DOA: 06/20/2014 PCP: Tammi Sou, MD  Brief narrative: 38 y.o. Male with UC diagnosed 15 years ago, presented with main concern of several weeks duration of abd cramps, bloody diarrhea, poor oral intake. He was started on Prednisone by PCP and was initially doing well but as he tapered Prednisone down, his diarrhea got worse.   Assessment and Plan:    Active Problems:  Ulcerative colitis - severe inflammation on colonoscopy, results noted below - appreciate GI input - recommendation is to continue IV steroids (solumedrol 56m twice daily) and full dose mesalamine (lialda 4.8grms) for now - advanced diet to clears   Hypokalemia - secondary to diarrhea - supplemented and WNL this AM  Leukocytosis - likely steroid induced - no sings of an infectious etiology, no indications for ABX - WBC WNL this AM   DVT prophylaxis  SCD's  Code Status: Full Family Communication: Pt and family at bedside Disposition Plan: Home when medically stable  IV Access:    Peripheral IV Procedures and diagnostic studies:    Ct Abdomen Pelvis W Contrast 06/20/2014 Mild wall thickening throughout the entire colon without significant adjacent mesenteric inflammatory or free fluid. Findings are compatible with patient's known ulcerative colitis likely representing active inflammation.   Colonoscopy 06/22/2014 There was severe inflammation from the distal rectum to the extent of the exam (splenic flexure). The mucosa was severely inflamed and there were mutliple ulcers scattered throughout the visualized colon. There was no overt signs of C. diff. Medical Consultants:    GI Other Consultants:    None  Anti-Infectives:    None  MFaye Ramsay MD  [facility] Pager [phone] If 7PM-7AM, please contact  night-coverage [email or web address] Password [email or web address] 06/23/2014, 11:09 AM   LOS: 3 days   HPI/Subjective: No events overnight.   Objective: Filed Vitals:   06/22/14 1010 06/22/14 1424 06/22/14 2149 06/23/14 0546  BP:  151/89 132/80 122/54  Pulse: 89 83 102 75  Temp:  98.7 F (37.1 C) 98.2 F (36.8 C) 97.9 F (36.6 C)  TempSrc:  Oral Oral Oral  Resp: 16 20 20 20   Height:      Weight:      SpO2: 97% 97% 95% 97%   No intake or output data in the 24 hours ending 06/23/14 1109  Exam:   General:  Pt is alert, follows commands appropriately, not in acute distress  Cardiovascular: Regular rate and rhythm, S1/S2, no murmurs, no rubs, no gallops  Respiratory: Clear to auscultation bilaterally, no wheezing, no crackles, no rhonchi  Abdomen: Soft, non tender, non distended, bowel sounds present, no guarding  Data Reviewed: Basic Metabolic Panel:  Recent Labs Lab 06/20/14 1837 06/21/14 0424 06/22/14 0428  NA 137 134* 136*  K 3.4* 3.8 4.2  CL 94* 97 94*  CO2 30 29 29   GLUCOSE 112* 122* 144*  BUN 12 9 8   CREATININE 1.00 0.92 0.81  CALCIUM 8.7 8.1* 8.5  MG  --  2.2  --   PHOS  --  3.2  --    Liver Function Tests:  Recent Labs Lab 06/20/14 1837 06/21/14 0424 06/22/14 0428  AST 8 6 7   ALT 11 7 8   ALKPHOS 66 53 66  BILITOT 0.2* <0.2* 0.3  PROT 7.0 5.6* 6.9  ALBUMIN 2.6* 2.1* 2.4*    Recent Labs Lab 06/20/14 1837  LIPASE  31   No results for input(s): AMMONIA in the last 168 hours. CBC:  Recent Labs Lab 06/20/14 1837 06/21/14 0424 06/22/14 0428 06/23/14 0530  WBC 15.7* 10.1 15.7* 8.7  NEUTROABS 11.6*  --   --   --   HGB 13.0 11.1* 12.8* 11.7*  HCT 38.6* 34.3* 38.6* 35.5*  MCV 89.1 90.0 89.4 89.9  PLT 403* 351 395 357     Scheduled Meds: . sodium chloride   Intravenous Once  . mesalamine  4.8 g Oral Q breakfast  . methylPREDNISolone (SOLU-MEDROL) injection  40 mg Intravenous BID   Continuous Infusions: . sodium chloride 75 mL/hr at 06/21/14 1303

## 2014-06-24 LAB — BASIC METABOLIC PANEL
Anion gap: 11 (ref 5–15)
BUN: 10 mg/dL (ref 6–23)
CALCIUM: 8.3 mg/dL — AB (ref 8.4–10.5)
CO2: 30 mEq/L (ref 19–32)
CREATININE: 0.79 mg/dL (ref 0.50–1.35)
Chloride: 95 mEq/L — ABNORMAL LOW (ref 96–112)
GFR calc non Af Amer: >90 mL/min (ref >90)
Glucose, Bld: 156 mg/dL — ABNORMAL HIGH (ref 70–99)
POTASSIUM: 4.6 meq/L (ref 3.7–5.3)
Sodium: 136 mEq/L — ABNORMAL LOW (ref 137–147)

## 2014-06-24 NOTE — Plan of Care (Signed)
Problem: Phase I Progression Outcomes Goal: Hemodynamically stable Outcome: Progressing

## 2014-06-24 NOTE — Progress Notes (Signed)
Subjective: No acute events, but he reports abdominal pain.  He wanted to wean down on the pain meds.  A few bowel movements, but no blood in the stool  Objective: Vital signs in last 24 hours: Temp:  [98 F (36.7 C)-98.2 F (36.8 C)] 98 F (36.7 C) (11/15 0535) Pulse Rate:  [69-77] 69 (11/15 0535) Resp:  [18] 18 (11/15 0535) BP: (124-135)/(72-80) 124/72 mmHg (11/15 0535) SpO2:  [97 %-98 %] 98 % (11/15 0535) Last BM Date: 06/23/14  Intake/Output from previous day: 11/14 0701 - 11/15 0700 In: 1387 [P.O.:840; I.V.:547] Out: -  Intake/Output this shift: Total I/O In: 547 [I.V.:547] Out: -   General appearance: alert and no distress GI: soft, non-tender; bowel sounds normal; no masses,  no organomegaly  Lab Results:  Recent Labs  06/22/14 0428 06/23/14 0530  WBC 15.7* 8.7  HGB 12.8* 11.7*  HCT 38.6* 35.5*  PLT 395 357   BMET  Recent Labs  06/22/14 0428 06/24/14 0516  NA 136* 136*  K 4.2 4.6  CL 94* 95*  CO2 29 30  GLUCOSE 144* 156*  BUN 8 10  CREATININE 0.81 0.79  CALCIUM 8.5 8.3*   LFT  Recent Labs  06/22/14 0428  PROT 6.9  ALBUMIN 2.4*  AST 7  ALT 8  ALKPHOS 66  BILITOT 0.3   PT/INR No results for input(s): LABPROT, INR in the last 72 hours. Hepatitis Panel  Recent Labs  06/21/14 1031  HEPBSAG NEGATIVE   C-Diff No results for input(s): CDIFFTOX in the last 72 hours. Fecal Lactopherrin No results for input(s): FECLLACTOFRN in the last 72 hours.  Studies/Results: No results found.  Medications:  Scheduled: . sodium chloride   Intravenous Once  . mesalamine  4.8 g Oral Q breakfast  . methylPREDNISolone (SOLU-MEDROL) injection  40 mg Intravenous BID   Continuous: . sodium chloride 50 mL/hr at 06/24/14 0607    Assessment/Plan: 1) Severe UC. 2) ABM pain.   Clinically he is well.  No evidence of toxic megacolon, however, his clinical report appears that he has regressed a little.  His reported increase in pain is more of a  reflection that he is trying to avoid narcotics.  Plan: 1) Continue with steroids. 2) I encouraged pain medications.   LOS: 4 days   Lachina Salsberry D 06/24/2014, 6:39 AM

## 2014-06-24 NOTE — Plan of Care (Signed)
Problem: Phase I Progression Outcomes Goal: Hemodynamically stable Outcome: Completed/Met Date Met:  06/24/14     

## 2014-06-24 NOTE — Plan of Care (Signed)
Problem: Phase III Progression Outcomes Goal: Voiding independently Outcome: Completed/Met Date Met:  06/24/14

## 2014-06-24 NOTE — Progress Notes (Signed)
Patient ID: Keith Jensen, male   DOB: [birth year], 38 y.o.   MRN: [id number]  [facility] PROGRESS NOTE  MUHSIN DORIS SMO:[id number] DOB: [birth year] DOA: 06/20/2014 PCP: Tammi Sou, MD   Brief narrative: 38 y.o. Male with UC diagnosed 15 years ago, presented with main concern of several weeks duration of abd cramps, bloody diarrhea, poor oral intake. He was started on Prednisone by PCP and was initially doing well but as he tapered Prednisone down, his diarrhea got worse.   Assessment and Plan:    Active Problems:  Ulcerative colitis - severe inflammation on colonoscopy, results noted below - appreciate GI input - recommendation is to continue IV steroids (solumedrol 57m twice daily) and full dose mesalamine (lialda 4.8grms) for now - advanced diet to clears   Hypokalemia - secondary to diarrhea - supplemented and WNL this AM  Leukocytosis - likely steroid induced - WBC WNL this AM   DVT prophylaxis  SCD's  Code Status: Full Family Communication: Pt and family at bedside Disposition Plan: Home when medically stable  IV Access:    Peripheral IV Procedures and diagnostic studies:    Ct Abdomen Pelvis W Contrast 06/20/2014 Mild wall thickening throughout the entire colon without significant adjacent mesenteric inflammatory or free fluid. Findings are compatible with patient's known ulcerative colitis likely representing active inflammation.   Colonoscopy 06/22/2014 There was severe inflammation from the distal rectum to the extent of the exam (splenic flexure). The mucosa was severely inflamed and there were mutliple ulcers scattered throughout the visualized colon. There was no overt signs of C. diff. Medical Consultants:    GI Other Consultants:    None  Anti-Infectives:    None  MFaye Ramsay MD  [facility] Pager [phone] If 7PM-7AM, please contact night-coverage [email or web address] Password [email or web address] 06/24/2014, 1:15 PM   LOS: 4 days   HPI/Subjective: No events overnight.   Objective: Filed Vitals:   06/23/14 0546 06/23/14 1456 06/23/14 2121 06/24/14 0535  BP: 122/54 126/75 135/80 124/72  Pulse: 75 77 69 69  Temp: 97.9 F (36.6 C) 98.2 F (36.8 C) 98 F (36.7 C) 98 F (36.7 C)  TempSrc: Oral Oral Oral Oral  Resp: 20 18 18 18   Height:      Weight:      SpO2: 97% 98% 97% 98%    Intake/Output Summary (Last 24 hours) at 06/24/14 1315 Last data filed at 06/24/14 0607  Gross per 24 hour  Intake   1027 ml  Output      0 ml  Net   1027 ml    Exam:   General:  Pt is alert, follows commands appropriately, not in acute distress  Cardiovascular: Regular rate and rhythm, S1/S2, no murmurs, no rubs, no gallops  Respiratory: Clear to auscultation bilaterally, no wheezing, no crackles, no rhonchi  Abdomen: Soft, non tender, non distended, bowel sounds present, no guarding  Extremities: No edema, pulses DP and PT palpable bilaterally  Neuro: Grossly nonfocal  Data Reviewed: Basic Metabolic Panel:  Recent Labs Lab 06/20/14 1837 06/21/14 0424 06/22/14 0428 06/24/14 0516  NA 137 134* 136* 136*  K 3.4* 3.8 4.2 4.6  CL 94* 97 94* 95*  CO2 30 29 29 30   GLUCOSE 112* 122* 144* 156*  BUN 12 9 8 10   CREATININE 1.00 0.92 0.81 0.79  CALCIUM 8.7 8.1* 8.5 8.3*  MG  --  2.2  --   --   PHOS  --  3.2  --   --  Liver Function Tests:  Recent Labs Lab 06/20/14 1837 06/21/14 0424 06/22/14 0428  AST 8 6 7   ALT 11 7 8   ALKPHOS 66 53 66  BILITOT 0.2* <0.2* 0.3  PROT 7.0 5.6* 6.9  ALBUMIN 2.6* 2.1* 2.4*    Recent Labs Lab 06/20/14 1837  LIPASE 31   CBC:  Recent Labs Lab 06/20/14 1837 06/21/14 0424 06/22/14 0428 06/23/14 0530  WBC 15.7* 10.1 15.7* 8.7  NEUTROABS 11.6*  --   --   --   HGB 13.0 11.1* 12.8* 11.7*  HCT 38.6* 34.3* 38.6* 35.5*  MCV 89.1 90.0 89.4 89.9  PLT 403* 351 395 357   Scheduled Meds: . sodium chloride   Intravenous Once  . mesalamine  4.8 g Oral Q  breakfast  . methylPREDNISolone (SOLU-MEDROL) injection  40 mg Intravenous BID   Continuous Infusions: . sodium chloride 50 mL/hr at 06/24/14 0607

## 2014-06-25 ENCOUNTER — Encounter (HOSPITAL_COMMUNITY): Payer: Self-pay | Admitting: Gastroenterology

## 2014-06-25 DIAGNOSIS — K51911 Ulcerative colitis, unspecified with rectal bleeding: Secondary | ICD-10-CM

## 2014-06-25 LAB — QUANTIFERON TB GOLD ASSAY (BLOOD)
MITOGEN VALUE: 0.25 [IU]/mL
Quantiferon Nil Value: 0.02 IU/mL
TB AG VALUE: 0.02 [IU]/mL
TB ANTIGEN MINUS NIL VALUE: 0 [IU]/mL

## 2014-06-25 MED ORDER — MESALAMINE 1.2 G PO TBEC
4.8000 g | DELAYED_RELEASE_TABLET | Freq: Every morning | ORAL | Status: DC
  Administered 2014-06-26: 4.8 g via ORAL
  Filled 2014-06-25 (×2): qty 4

## 2014-06-25 NOTE — Progress Notes (Signed)
Patient ID: Keith Jensen, male   DOB: [birth year], 38 y.o.   MRN: [id number]  [facility] PROGRESS NOTE  Keith Jensen SPQ:[id number] DOB: [birth year] DOA: 06/20/2014 PCP: Keith Sou, MD   Brief narrative: 38 y.o. Male with UC diagnosed 15 years ago, presented with main concern of several weeks duration of abd cramps, bloody diarrhea, poor oral intake. He was started on Prednisone by PCP and was initially doing well but as he tapered Prednisone down, his diarrhea got worse.   Assessment and Plan:    Active Problems:  Ulcerative colitis - severe inflammation on colonoscopy, results noted below - appreciate GI input - recommendation is to continue IV steroids (solumedrol 87m twice daily) and full dose mesalamine (lialda 4.8grms) for now - pt tolerating clears   Hypokalemia - secondary to diarrhea - supplemented and WNL this AM  Leukocytosis - likely steroid induced - WBC WNL this AM   DVT prophylaxis  SCD's  Code Status: Full Family Communication: Pt and family at bedside Disposition Plan: Home when medically stable  IV Access:    Peripheral IV Procedures and diagnostic studies:    Ct Abdomen Pelvis W Contrast 06/20/2014 Mild wall thickening throughout the entire colon without significant adjacent mesenteric inflammatory or free fluid. Findings are compatible with patient's known ulcerative colitis likely representing active inflammation.   Colonoscopy 06/22/2014 There was severe inflammation from the distal rectum to the extent of the exam (splenic flexure). The mucosa was severely inflamed and there were mutliple ulcers scattered throughout the visualized colon. There was no overt signs of C. diff. Medical Consultants:    GI Other Consultants:    None  Anti-Infectives:    None    MFaye Ramsay MD  [facility] Pager [phone] If 7PM-7AM, please contact night-coverage [email or web address] Password [email or web address] 06/25/2014, 2:36  PM   LOS: 5 days   HPI/Subjective: No events overnight.   Objective: Filed Vitals:   06/24/14 1344 06/24/14 2117 06/25/14 0534 06/25/14 1327  BP: 136/85 125/79 137/90 148/85  Pulse: 74 74 62 91  Temp: 97.9 F (36.6 C) 98.1 F (36.7 C) 98.1 F (36.7 C) 98.6 F (37 C)  TempSrc: Oral Oral Oral Oral  Resp: 18 18 18 20   Height:      Weight:      SpO2: 98% 97% 98% 97%    Intake/Output Summary (Last 24 hours) at 06/25/14 1436 Last data filed at 06/24/14 1837  Gross per 24 hour  Intake    480 ml  Output      0 ml  Net    480 ml    Exam:   General:  Pt is alert, follows commands appropriately, not in acute distress  Cardiovascular: Regular rate and rhythm, S1/S2, no murmurs, no rubs, no gallops  Respiratory: Clear to auscultation bilaterally, no wheezing, no crackles, no rhonchi  Abdomen: Soft, non tender, non distended, bowel sounds present, no guarding  Extremities: No edema, pulses DP and PT palpable bilaterally  Neuro: Grossly nonfocal  Data Reviewed: Basic Metabolic Panel:  Recent Labs Lab 06/20/14 1837 06/21/14 0424 06/22/14 0428 06/24/14 0516  NA 137 134* 136* 136*  K 3.4* 3.8 4.2 4.6  CL 94* 97 94* 95*  CO2 30 29 29 30   GLUCOSE 112* 122* 144* 156*  BUN 12 9 8 10   CREATININE 1.00 0.92 0.81 0.79  CALCIUM 8.7 8.1* 8.5 8.3*  MG  --  2.2  --   --   PHOS  --  3.2  --   --  Liver Function Tests:  Recent Labs Lab 06/20/14 1837 06/21/14 0424 06/22/14 0428  AST 8 6 7   ALT 11 7 8   ALKPHOS 66 53 66  BILITOT 0.2* <0.2* 0.3  PROT 7.0 5.6* 6.9  ALBUMIN 2.6* 2.1* 2.4*    Recent Labs Lab 06/20/14 1837  LIPASE 31   CBC:  Recent Labs Lab 06/20/14 1837 06/21/14 0424 06/22/14 0428 06/23/14 0530  WBC 15.7* 10.1 15.7* 8.7  NEUTROABS 11.6*  --   --   --   HGB 13.0 11.1* 12.8* 11.7*  HCT 38.6* 34.3* 38.6* 35.5*  MCV 89.1 90.0 89.4 89.9  PLT 403* 351 395 357   Scheduled Meds: . sodium chloride   Intravenous Once  . mesalamine  4.8 g Oral  q morning - 10a  . methylPREDNISolone (SOLU-MEDROL) injection  40 mg Intravenous BID   Continuous Infusions: . sodium chloride 50 mL/hr at 06/25/14 [phone]

## 2014-06-25 NOTE — Progress Notes (Signed)
   Subjective  Feeling much better  In last few days.    Objective   Severe UC - on IV steroids, having 3-4 liquid stools/day, not at noght. Would like to advance diet, Vital signs in last 24 hours: Temp:  [97.9 F (36.6 C)-98.1 F (36.7 C)] 98.1 F (36.7 C) (11/16 0534) Pulse Rate:  [62-74] 62 (11/16 0534) Resp:  [18] 18 (11/16 0534) BP: (125-137)/(79-90) 137/90 mmHg (11/16 0534) SpO2:  [97 %-98 %] 98 % (11/16 0534) Last BM Date: 06/23/14 General:    white malein NAD Heart:  Regular rate and rhythm; no murmurs Lungs: Respirations even and unlabored, lungs CTA bilaterally Abdomen:  Soft, tender and nondistended. Normal bowel sounds. Extremities:  Without edema. Neurologic:  Alert and oriented,  grossly normal neurologically. Psych:  Cooperative. Normal mood and affect.  Intake/Output from previous day: 11/15 0701 - 11/16 0700 In: 480 [P.O.:480] Out: -  Intake/Output this shift:    Lab Results:  Recent Labs  06/23/14 0530  WBC 8.7  HGB 11.7*  HCT 35.5*  PLT 357   BMET  Recent Labs  06/24/14 0516  NA 136*  K 4.6  CL 95*  CO2 30  GLUCOSE 156*  BUN 10  CREATININE 0.79  CALCIUM 8.3*   LFT No results for input(s): PROT, ALBUMIN, AST, ALT, ALKPHOS, BILITOT, BILIDIR, IBILI in the last 72 hours. PT/INR No results for input(s): LABPROT, INR in the last 72 hours.  Studies/Results: No results found.     Assessment / Plan:   Severe UC as per flexible sigmoidoscopy 3 days ago by Dr Ardis Hughs, symptomatically improved Will be started on Biologicals after discharge- will talk to Dr Fuller Plan who is his GI MD, also will have to check with his insurance for preferences Lialda 4.8 gm daily Solumedrol 40 mg IV q 12 hours Advance to full liquids Check  CBC in am  Active Problems:   Ulcerative colitis   Hypokalemia   Ulcerative colitis, acute     LOS: 5 days   Delfin Edis  06/25/2014, 9:15 AM

## 2014-06-26 ENCOUNTER — Encounter: Payer: Self-pay | Admitting: Family Medicine

## 2014-06-26 ENCOUNTER — Inpatient Hospital Stay (HOSPITAL_COMMUNITY): Payer: No Typology Code available for payment source

## 2014-06-26 ENCOUNTER — Other Ambulatory Visit: Payer: Self-pay

## 2014-06-26 ENCOUNTER — Telehealth: Payer: Self-pay | Admitting: Internal Medicine

## 2014-06-26 DIAGNOSIS — Z139 Encounter for screening, unspecified: Secondary | ICD-10-CM | POA: Insufficient documentation

## 2014-06-26 LAB — CBC
HCT: 35.8 % — ABNORMAL LOW (ref 39.0–52.0)
Hemoglobin: 11.9 g/dL — ABNORMAL LOW (ref 13.0–17.0)
MCH: 29 pg (ref 26.0–34.0)
MCHC: 33.2 g/dL (ref 30.0–36.0)
MCV: 87.3 fL (ref 78.0–100.0)
PLATELETS: 415 10*3/uL — AB (ref 150–400)
RBC: 4.1 MIL/uL — ABNORMAL LOW (ref 4.22–5.81)
RDW: 12.9 % (ref 11.5–15.5)
WBC: 9.9 10*3/uL (ref 4.0–10.5)

## 2014-06-26 MED ORDER — ADALIMUMAB 40 MG/0.8ML ~~LOC~~ AJKT: 40.0000 mg | AUTO-INJECTOR | SUBCUTANEOUS | Status: DC

## 2014-06-26 MED ORDER — ADALIMUMAB 40 MG/0.8ML ~~LOC~~ AJKT: 160.0000 mg | AUTO-INJECTOR | SUBCUTANEOUS | Status: DC

## 2014-06-26 MED ORDER — PNEUMOCOCCAL VAC POLYVALENT 25 MCG/0.5ML IJ INJ: 0.5000 mL | INJECTION | INTRAMUSCULAR | Status: DC | PRN

## 2014-06-26 MED ORDER — MESALAMINE 1.2 G PO TBEC: 4.8000 g | DELAYED_RELEASE_TABLET | Freq: Every morning | ORAL | Status: DC

## 2014-06-26 MED ORDER — LORAZEPAM 1 MG PO TABS: 1.0000 mg | ORAL_TABLET | Freq: Two times a day (BID) | ORAL | Status: DC | PRN

## 2014-06-26 MED ORDER — HYDROMORPHONE HCL 4 MG PO TABS: 4.0000 mg | ORAL_TABLET | ORAL | Status: DC | PRN

## 2014-06-26 MED ORDER — PREDNISONE 20 MG PO TABS: ORAL_TABLET | ORAL | Status: DC

## 2014-06-26 NOTE — Progress Notes (Signed)
Patient ID: Keith Jensen, male   DOB: 1976/02/19, 38 y.o.   MRN: [id number]  [facility] Progress Note  Subjective: Feeling better- abdominal pain has improved- not requiring regular narcotic doses. 4-5 Bm past 24 hours, much fewer than on admit Gi pathogen panel negative Bx consistent with ulcerative colitis HeP B serologies negative, Quantiferon gold- indet Had influenza vaccine outpt, has not had pneumovax  Objective:  Vital signs in last 24 hours: Temp:  [98.2 F (36.8 C)-98.7 F (37.1 C)] 98.2 F (36.8 C) (11/17 0527) Pulse Rate:  [67-92] 67 (11/17 0527) Resp:  [18-20] 18 (11/17 0527) BP: (133-148)/(74-88) 133/74 mmHg (11/17 0527) SpO2:  [96 %-98 %] 98 % (11/17 0527) Last BM Date: 06/25/14 General:   Alert,  Well-developed,WM    in NAD Heart:  Regular rate and rhythm; no murmurs Pulm;clear Abdomen:  Soft, minimally tender and nondistended. Normal bowel sounds, without guarding, and without rebound.   Extremities:  Without edema. Neurologic:  Alert and  oriented x4;  grossly normal neurologically. Psych:  Alert and cooperative. Normal mood and affect.  Intake/Output from previous day: 11/16 0701 - 11/17 0700 In: 2394.2 [I.V.:2394.2] Out: -  Intake/Output this shift:    Lab Results:  Recent Labs  06/26/14 0520  WBC 9.9  HGB 11.9*  HCT 35.8*  PLT 415*   BMET  Recent Labs  06/24/14 0516  NA 136*  K 4.6  CL 95*  CO2 30  GLUCOSE 156*  BUN 10  CREATININE 0.79  CALCIUM 8.3*      Assessment / Plan: #1 38 yo male with severe pan ulcerative colitis- he is responding to high dose steroids Will advance to soft diet May be able to go home later today on 80 mg prednisone x 2 weeks then 60 mg x 2 weeks then gradually taper lialda 4.8 gm daily Vicodin prn Cxr today- as Quantiferon gold indeterminate Give Pneumovax  Today  office appt withDr. Fuller Plan 11/25 at 1:30 pm Getting Humira arranged through [facility]- informed pt of process, nurse will come  to his home to instruct etc   Active Problems:   Ulcerative colitis   Hypokalemia   Ulcerative colitis, acute     LOS: 6 days   Keith Jensen  06/26/2014, 8:58 AM  Attending MD note:   I have taken a history, examined the patient, and reviewed the chart. I agree with the Advanced Practitioner's impression and recommendations. Discussed with Dr Doyle Askew- home today on high dose steroid, plans for Humira, follow up with Dr Wanda [facility] Pager # [phone]

## 2014-06-26 NOTE — Discharge Summary (Signed)
Physician Discharge Summary  Keith Jensen:[id number] DOB: 01/26/1976 DOA: 06/20/2014  PCP: Tammi Sou, MD  Admit date: 06/20/2014 Discharge date: 06/26/2014  Recommendations for Outpatient Follow-up:  1. Pt will need to follow up with PCP in 2-3 weeks post discharge 2. Please obtain BMP to evaluate electrolytes and kidney function 3. Please also check CBC to evaluate Hg and Hct levels 4. Please note Prednisone taper below 5. Pt to follow up with GI specialist, office will schedule an appointment   Discharge Diagnoses:  Active Problems:   Ulcerative colitis   Hypokalemia   Ulcerative colitis, acute    Discharge Condition: Stable  Diet recommendation: Heart healthy diet discussed in details     Brief narrative: 38 y.o. Male with UC diagnosed 15 years ago, presented with main concern of several weeks duration of abd cramps, bloody diarrhea, poor oral intake. He was started on Prednisone by PCP and was initially doing well but as he tapered Prednisone down, his diarrhea got worse.   Assessment and Plan:    Active Problems:  Ulcerative colitis - severe inflammation on colonoscopy, results noted below - appreciate GI input - recommendation is to continue IV steroids (solumedrol 67m twice daily) and full dose mesalamine (lialda 4.8grms) for now - pt tolerating clears and wants to go home - plan on discharging on Prednisone taper noted below   Hypokalemia - secondary to diarrhea - supplemented and WNL this AM  Leukocytosis - likely steroid induced - WBC WNL this AM   DVT prophylaxis  SCD's  Code Status: Full Family Communication: Pt and family at bedside Disposition Plan: Home when medically stable  IV Access:    Peripheral IV Procedures and diagnostic studies:    Ct Abdomen Pelvis W Contrast 06/20/2014 Mild wall thickening throughout the entire colon without significant adjacent mesenteric inflammatory or free fluid. Findings are  compatible with patient's known ulcerative colitis likely representing active inflammation.   Colonoscopy 06/22/2014 There was severe inflammation from the distal rectum to the extent of the exam (splenic flexure). The mucosa was severely inflamed and there were mutliple ulcers scattered throughout the visualized colon. There was no overt signs of C. diff. Medical Consultants:    GI Other Consultants:    None  Anti-Infectives:    None    Discharge Exam: Filed Vitals:   06/26/14 0527  BP: 133/74  Pulse: 67  Temp: 98.2 F (36.8 C)  Resp: 18   Filed Vitals:   06/25/14 0534 06/25/14 1327 06/25/14 2110 06/26/14 0527  BP: 137/90 148/85 136/88 133/74  Pulse: 62 91 92 67  Temp: 98.1 F (36.7 C) 98.6 F (37 C) 98.7 F (37.1 C) 98.2 F (36.8 C)  TempSrc: Oral Oral Oral Oral  Resp: 18 20 18 18   Height:      Weight:      SpO2: 98% 97% 96% 98%    General: Pt is alert, follows commands appropriately, not in acute distress Cardiovascular: Regular rate and rhythm, S1/S2 +, no murmurs, no rubs, no gallops Respiratory: Clear to auscultation bilaterally, no wheezing, no crackles, no rhonchi Abdominal: Soft, non tender, non distended, bowel sounds +, no guarding Extremities: no edema, no cyanosis, pulses palpable bilaterally DP and PT Neuro: Grossly nonfocal  Discharge Instructions  Discharge Instructions    Diet - low sodium heart healthy    Complete by:  As directed      Increase activity slowly    Complete by:  As directed  Medication List    STOP taking these medications        MOVIPREP 100 G Solr  Generic drug:  peg 3350 powder      TAKE these medications        HYDROmorphone 4 MG tablet  Commonly known as:  DILAUDID  Take 1 tablet (4 mg total) by mouth every 4 (four) hours as needed for severe pain.     LORazepam 1 MG tablet  Commonly known as:  ATIVAN  Take 1 tablet (1 mg total) by mouth 2 (two) times daily as needed for  anxiety.     mesalamine 1.2 G EC tablet  Commonly known as:  LIALDA  Take 4 tablets (4.8 g total) by mouth every morning.     predniSONE 20 MG tablet  Commonly known as:  DELTASONE  Take 80 mg by mouth for 14 days, then 60 mg for 14 days, see doctor for instruction on continuation of tapering.           Follow-up Information    Follow up with Norberto Sorenson T. Fuller Plan, MD On 07/04/2014.   Specialty:  Gastroenterology   Why:  at 1:30 pm   Contact information:   [address] [place] [address] [phone]       Follow up with Tammi Sou, MD.   Specialty:  Family Medicine   Contact information:   [address] [place] [address] [phone]       Follow up with Faye Ramsay, MD.   Specialty:  Internal Medicine   Why:  If symptoms worsen, call my cell [phone]   Contact information:   [address] [place] [address] [phone]        The results of significant diagnostics from this hospitalization (including imaging, microbiology, ancillary and laboratory) are listed below for reference.     Microbiology: No results found for this or any previous visit (from the past 240 hour(s)).   Labs: Basic Metabolic Panel:  Recent Labs Lab 06/20/14 1837 06/21/14 0424 06/22/14 0428 06/24/14 0516  NA 137 134* 136* 136*  K 3.4* 3.8 4.2 4.6  CL 94* 97 94* 95*  CO2 30 29 29 30   GLUCOSE 112* 122* 144* 156*  BUN 12 9 8 10   CREATININE 1.00 0.92 0.81 0.79  CALCIUM 8.7 8.1* 8.5 8.3*  MG  --  2.2  --   --   PHOS  --  3.2  --   --    Liver Function Tests:  Recent Labs Lab 06/20/14 1837 06/21/14 0424 06/22/14 0428  AST 8 6 7   ALT 11 7 8   ALKPHOS 66 53 66  BILITOT 0.2* <0.2* 0.3  PROT 7.0 5.6* 6.9  ALBUMIN 2.6* 2.1* 2.4*    Recent Labs Lab 06/20/14 1837  LIPASE 31   No results for input(s): AMMONIA in the last 168 hours. CBC:  Recent Labs Lab 06/20/14 1837 06/21/14 0424 06/22/14 0428 06/23/14 0530  06/26/14 0520  WBC 15.7* 10.1 15.7* 8.7 9.9  NEUTROABS 11.6*  --   --   --   --   HGB 13.0 11.1* 12.8* 11.7* 11.9*  HCT 38.6* 34.3* 38.6* 35.5* 35.8*  MCV 89.1 90.0 89.4 89.9 87.3  PLT 403* 351 395 357 415*   Cardiac Enzymes: No results for input(s): CKTOTAL, CKMB, CKMBINDEX, TROPONINI in the last 168 hours. BNP: BNP (last 3 results) No results for input(s): PROBNP in the last 8760 hours. CBG: No results for input(s):  GLUCAP in the last 168 hours.   SIGNED: Time coordinating discharge: Over 30 minutes  Faye Ramsay, MD  [facility] 06/26/2014, 11:03 AM Pager [phone]  If 7PM-7AM, please contact night-coverage [email or web address] Password [email or web address]

## 2014-06-26 NOTE — Discharge Instructions (Signed)
You will be contacted by [facility] the company that provides Humira- you should hear from them within  A week- if you do not call  Dr. Lynne Leader nurse Barbera Setters at office [phone]  They will have Humira delivered to your home and set up an appt for a nurse to come to your home to teach you how to do injections and give you the first injection

## 2014-06-26 NOTE — Telephone Encounter (Signed)
Hospital notes, colon, and labs faxed to Amy at [phone] per her request

## 2014-06-27 ENCOUNTER — Encounter: Payer: No Typology Code available for payment source | Admitting: Gastroenterology

## 2014-06-29 ENCOUNTER — Telehealth: Payer: Self-pay | Admitting: Gastroenterology

## 2014-06-29 NOTE — Telephone Encounter (Signed)
Left message for patient to call back  

## 2014-06-29 NOTE — Telephone Encounter (Signed)
Patient advised that he does need to start on Humira.  It has been approved by his insurance and that he should get a call from [facility] pharmacy and I have also given him the number.  He is advised that they will arrange teaching and delivery of medications

## 2014-07-04 ENCOUNTER — Encounter: Payer: Self-pay | Admitting: Gastroenterology

## 2014-07-04 ENCOUNTER — Ambulatory Visit (INDEPENDENT_AMBULATORY_CARE_PROVIDER_SITE_OTHER): Payer: No Typology Code available for payment source | Admitting: Gastroenterology

## 2014-07-04 VITALS — BP 122/80 | HR 112 | Ht 69.0 in | Wt 173.0 lb

## 2014-07-04 DIAGNOSIS — K51918 Ulcerative colitis, unspecified with other complication: Secondary | ICD-10-CM

## 2014-07-04 LAB — MISCELLANEOUS TEST

## 2014-07-04 NOTE — Progress Notes (Signed)
    History of Present Illness: This is a 38 year old male with ulcerative colitis who was recently hospitalized for one week with a severe flare. CT scan performed earlier this month listed below. Colonoscopy performed in the hospital by Dr. Ardis Hughs was only completed to the splenic flexure due to the severity of his colitis. He was treated with prednisone and his symptoms improved. He was not started on biologics during the hospitalization and he was discharged with recommendations to start biologics shortly after discharge. He's been managed on prednisone 80 mg daily since discharge and he notes continued improvement in symptoms. He is having 3 looser bowel movements per day without bleeding or abdominal pain. He is not using Dilaudid or Ativan. He has had several contacts with [facility] and [facility] since discharge however he has not been able to obtain or begin Humira as recommended and planned. He notes difficulties sleeping and mild weakness in his lower extremities. He lost about 20 pounds.  IMPRESSION abd/pelvic CT: Mild wall thickening throughout the entire colon without significant adjacent mesenteric inflammatory or free fluid. Findings are compatible with patient's known ulcerative colitis likely representing active inflammation.  Current Medications, Allergies, Past Medical History, Past Surgical History, Family History and Social History were reviewed in [employer] record.  Physical Exam: General: Well developed , well nourished, fatigued-appearing, no acute distress Head: Normocephalic and atraumatic Eyes:  sclerae anicteric, EOMI Ears: Normal auditory acuity Mouth: No deformity or lesions Lungs: Clear throughout to auscultation Heart: Regular rate and rhythm; no murmurs, rubs or bruits Abdomen: Soft, mild lower abdominal tenderness without rebound or guarding and non distended. No masses, hepatosplenomegaly or hernias noted. Normal Bowel  sounds Musculoskeletal: Symmetrical with no gross deformities  Pulses:  Normal pulses noted Extremities: No clubbing, cyanosis, edema or deformities noted Neurological: Alert oriented x 4, grossly nonfocal Psychological:  Alert and cooperative. Normal mood and affect  Assessment and Recommendations:  1. Ulcerative pancolitis with severe flare requiring hospitalization and accompanied by 20 pound weight loss. His symptoms have substantially improved on prednisone 80 mg daily and Lialda. We had a Bahr discussion about the risks, benefits and alternatives to biologics, immunosuppressive medications and the possible course of severe ulcerative colitis. Ample time for questions was provided and I attempted to answer all of his questions. Many of his questions had been previously addressed by Drs. Ardis Hughs and Olevia Perches and Barb Merino, RN. Unfortunately, despite extensive assistance from [facility] since discharge, he has not been able to coordinate beginning Humira injections via [facility] and [facility]. We will again attempt to help him coordinate biweekly Humira injections. If this proves to be too difficult we will switch to Remicade. Taper prednisone to 60 mg daily for 7 days and then decreased to 40 mg daily until his follow-up office visit. Continue Lialda 4.8 g daily. He is advised to call if he has any difficulties in obtaining and starting Humira within the next week or if his symptoms worsen. Discontinue Dilaudid and Ativan. May use Benadryl 25 mg at bedtime as needed for insomnia. Face-to-face time and coordination of care exceeded 60 minutes. Return office visit in 4 weeks.

## 2014-07-04 NOTE — Patient Instructions (Signed)
Follow up with Dr Fuller Plan on 08/08/2014 at 2:15pm  Barb Merino RN will be contacting you about your Humeria treatments

## 2014-07-11 ENCOUNTER — Telehealth: Payer: Self-pay | Admitting: Gastroenterology

## 2014-07-11 MED ORDER — ADALIMUMAB 40 MG/0.8ML ~~LOC~~ AJKT: 160.0000 mg | AUTO-INJECTOR | SUBCUTANEOUS | Status: DC

## 2014-07-11 MED ORDER — ADALIMUMAB 40 MG/0.8ML ~~LOC~~ AJKT: 40.0000 mg | AUTO-INJECTOR | SUBCUTANEOUS | Status: DC

## 2014-07-11 NOTE — Telephone Encounter (Signed)
Patient instructed to contact [facility] and provide his credit card information to ship out the drug.  I spoke with [facility] and they are waiting on the patient to make contact.  I also spoke with Amy an [facility] pharmacy, they have arranged with [facility] to arrange teaching.  She will confirm that teaching has been arranged.  Keith Jensen verbalized understanding to contact [facility] to arrange payment

## 2014-07-12 NOTE — Telephone Encounter (Signed)
I spoke with Keith Jensen.  He has not arranged for Humira delivery.  He states that he left a voicemail for [facility] yesterday.  I asked him to call again, I explained that this is a large pharmacy and that he should just wait on hold until he can speak with someone.  He asked me what he was telling them when he called.  I explained again that he will need to provide his payment information so they can send him out the Humira.  He is also advised that he will need to call the Humira ambassador at the number listed below to arrange for teaching. I asked that he call [facility] first and they will give him a delivery date, then he can call the ambassador program  to set up teaching.  I advised him the [name] will want to know the delivery date to schedule the teaching.  He verbalized understanding.

## 2014-07-12 NOTE — Telephone Encounter (Signed)
I spoke with Amy again this am with [facility].  The Humira ambassador has attempted and left him 3 messages to call back to arrange teaching.  He needs to call [phone] press option #1 then option #2 then dial direct extention [phone] Left message for patient to call back

## 2014-07-21 ENCOUNTER — Other Ambulatory Visit: Payer: Self-pay | Admitting: Internal Medicine

## 2014-08-01 ENCOUNTER — Encounter: Payer: Self-pay | Admitting: *Deleted

## 2014-08-02 ENCOUNTER — Ambulatory Visit: Payer: No Typology Code available for payment source | Admitting: Internal Medicine

## 2014-08-08 ENCOUNTER — Ambulatory Visit (INDEPENDENT_AMBULATORY_CARE_PROVIDER_SITE_OTHER): Payer: No Typology Code available for payment source | Admitting: Gastroenterology

## 2014-08-08 ENCOUNTER — Encounter: Payer: Self-pay | Admitting: Gastroenterology

## 2014-08-08 VITALS — BP 140/88 | HR 80 | Ht 69.0 in | Wt 188.5 lb

## 2014-08-08 DIAGNOSIS — K51918 Ulcerative colitis, unspecified with other complication: Secondary | ICD-10-CM

## 2014-08-08 NOTE — Progress Notes (Signed)
    History of Present Illness: This is a 38 year old male with ulcerative colitis who was recently hospitalized for one week with a severe flare. Colonoscopy performed in the hospital by Dr. Ardis Hughs was only completed to the splenic flexure due to the severity of his colitis. He was treated with prednisone and his symptoms improved. He was not started on biologics during the hospitalization and he was discharged with recommendations to start biologics shortly after discharge. He's tapered prednisone to 30 mg daily and he notes continued improvement in symptoms. He is having 2 semi-formed bowel movements per day without bleeding or abdominal pain. He has had several contacts with [facility], [facility] and our office since discharge however he has not been able to obtain or begin Humira as recommended and planned. It is really not clear to me where the problem is occurring with him starting Humira.  Current Medications, Allergies, Past Medical History, Past Surgical History, Family History and Social History were reviewed in [employer] record.  Physical Exam: General: Well developed , well nourished, no acute distress Head: Normocephalic and atraumatic Eyes:  sclerae anicteric, EOMI Ears: Normal auditory acuity Mouth: No deformity or lesions Lungs: Clear throughout to auscultation Heart: Regular rate and rhythm; no murmurs, rubs or bruits Abdomen: Soft, non tender and non distended. No masses, hepatosplenomegaly or hernias noted. Normal Bowel sounds Musculoskeletal: Symmetrical with no gross deformities  Pulses:  Normal pulses noted Extremities: No clubbing, cyanosis, edema or deformities noted Neurological: Alert oriented x 4, grossly nonfocal Psychological:  Alert and cooperative. Normal mood and affect  Assessment and Recommendations:  1. Ulcerative pancolitis with severe flare requiring hospitalization and accompanied by 20 pound weight loss. His symptoms have  substantially improved on prednisone and Lialda. We again had a [place] discussion about the risks, benefits and alternatives to biologics, immunosuppressive medications and the possible course of severe ulcerative colitis. Ample time for questions was provided and I attempted to answer all of his questions. Many of his questions had been previously addressed at his last office visit by me, by Drs. Ardis Hughs and Olevia Perches during his hospitalization and by Barb Merino, RN several times since discharge. Unfortunately, despite extensive assistance, he has not been able to coordinate beginning Humira injections via [facility] and [facility]. We will again attempt to help him coordinate biweekly Humira injections. If this proves to be too difficult we will switch to Remicade. Taper prednisone to 20 mg daily starting in 7 days and then maintain prednisone 20 mg daily until his follow-up office visit. Continue Lialda 4.8 g daily. He is advised to call next week if he has any difficulties in obtaining and starting Humira within the next week or if his symptoms worsen. I stressed that it was extremely important that he begin Humira as soon as possible to help control his ulcerative colitis and to allow [place] to taper and discontinue prednisone as soon as possible. He clearly seems to understand the management plan and he concurs. Face-to-face time and coordination of care exceeded 45 minutes. Return office visit in 4 weeks.

## 2014-08-08 NOTE — Patient Instructions (Signed)
The phone number to the [employer] program is [phone] press option #1 then option #2 then dial direct extention [phone]. We will call you next week to follow up.  Stay on prednisone 20 mg daily until scheduled appointment with Dr. Fuller Plan. The appointment is scheduled for 09/10/14 at 3:45pm.   Thank you for choosing me and [facility].  Pricilla Riffle. Dagoberto Ligas., MD., Marval Regal

## 2014-09-10 ENCOUNTER — Encounter: Payer: Self-pay | Admitting: Gastroenterology

## 2014-09-10 ENCOUNTER — Ambulatory Visit (INDEPENDENT_AMBULATORY_CARE_PROVIDER_SITE_OTHER): Payer: No Typology Code available for payment source | Admitting: Gastroenterology

## 2014-09-10 VITALS — BP 134/82 | HR 100 | Ht 69.0 in | Wt 198.1 lb

## 2014-09-10 DIAGNOSIS — K51919 Ulcerative colitis, unspecified with unspecified complications: Secondary | ICD-10-CM

## 2014-09-10 MED ORDER — PREDNISONE 10 MG PO TABS: ORAL_TABLET | ORAL | Status: DC

## 2014-09-10 MED ORDER — PREDNISONE 5 MG PO TABS
ORAL_TABLET | ORAL | Status: DC
Start: 2014-09-10 — End: 2014-10-01

## 2014-09-10 NOTE — Patient Instructions (Addendum)
Decrease your prednisone to 15 mg tablets daily x 7 days, then decrease to 10 mg tablets daily x 7 days, then decrease to 5 mg tablets daily x 7 days, then decrease to 5 mg tablets every other day x 7 days then stop.  Your follow up appt is scheduled with Dr. Fuller Plan on 10/16/14 at 9:45am. If you need to reschedule or cancel please call [phone].  Thank you for choosing me and [facility].  Pricilla Riffle. Dagoberto Ligas., MD., Marval Regal

## 2014-09-10 NOTE — Progress Notes (Signed)
    History of Present Illness: This is a 39 year old male with ulcerative colitis returning for follow-up. He started Humira 2 months ago. He currently taking prednisone 20 mg daily. He discontinued Lialda on his own shortly after discharge from hospital. He has gained about 25 pounds since late November. He has one formed bowel movement a day with no bleeding. He has no GI complaints. He feels very well. He has resumed exercising.  Current Medications, Allergies, Past Medical History, Past Surgical History, Family History and Social History were reviewed in [employer] record.  Physical Exam: General: Well developed , well nourished, no acute distress Head: Normocephalic and atraumatic Eyes:  sclerae anicteric, EOMI Ears: Normal auditory acuity Mouth: No deformity or lesions Lungs: Clear throughout to auscultation Heart: Regular rate and rhythm; no murmurs, rubs or bruits Abdomen: Soft, non tender and non distended. No masses, hepatosplenomegaly or hernias noted. Normal Bowel sounds Musculoskeletal: Symmetrical with no gross deformities  Pulses:  Normal pulses noted Extremities: No clubbing, cyanosis, edema or deformities noted Neurological: Alert oriented x 4, grossly nonfocal Psychological:  Alert and cooperative. Normal mood and affect  Assessment and Recommendations:  1. Ulcerative colitis. Stable 2 months on Humira. Continue Humira 40 mg subcutaneous every 14 days. Plan for taper of prednisone by 5 mg per week as outlined below. REV in 4-6 weeks.

## 2014-10-01 ENCOUNTER — Other Ambulatory Visit: Payer: Self-pay | Admitting: Gastroenterology

## 2014-10-16 ENCOUNTER — Encounter: Payer: Self-pay | Admitting: Gastroenterology

## 2014-10-16 ENCOUNTER — Ambulatory Visit (INDEPENDENT_AMBULATORY_CARE_PROVIDER_SITE_OTHER): Payer: No Typology Code available for payment source | Admitting: Gastroenterology

## 2014-10-16 VITALS — BP 130/88 | HR 84 | Ht 69.0 in | Wt 202.5 lb

## 2014-10-16 DIAGNOSIS — K5190 Ulcerative colitis, unspecified, without complications: Secondary | ICD-10-CM

## 2014-10-16 DIAGNOSIS — Z79899 Other long term (current) drug therapy: Secondary | ICD-10-CM

## 2014-10-16 NOTE — Patient Instructions (Signed)
Your pharmacy will contact us when you need a refill of Humira.   Please follow up with Dr. Fuller Plan in 3 months. We do not have a schedule out that far but we will contact you in a month to schedule.

## 2014-10-16 NOTE — Progress Notes (Signed)
History of Present Illness: This is a 39 year old male with ulcerative colitis. He was started on Humira 3 months ago. He completed a slow prednisone taper about one week ago. He has no gastrointestinal complaints and he feels very well. He notes occasional mild GI upset with caffeine. He appears to be comfortable with injections.  Allergies  Allergen Reactions  . Acyclovir And Related    Outpatient Prescriptions Prior to Visit  Medication Sig Dispense Refill  . Adalimumab (HUMIRA PEN) 40 MG/0.8ML PNKT Inject 40 mg into the skin every 14 (fourteen) days. 2 each 6  . predniSONE (DELTASONE) 10 MG tablet Decrease your prednisone to 15 mg tablets daily x 7 days, then decrease to 10 mg tablets daily x 7 days, then decrease to 5 mg tablets daily x 7 days, then decrease to 5 mg tablets every other day x 7 days then stop. 20 tablet 0  . predniSONE (DELTASONE) 5 MG tablet TAKE AS DIRECTED PER OTHER TAPER 15 tablet 0   No facility-administered medications prior to visit.   Past Medical History  Diagnosis Date  . 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  [facility] notes state that since 2001 he had multiple episodes of proctitis, proctoscopy done 2005 but biopsies not performed, then pt referred to GI and diagnostic colonoscopy was recommended but pt failed to schedule this b/c his bleeding resolved (most recent GI visit as per their old notes is 01/18/2012, with Dr. Wynetta Emery)  . Elevated blood pressure reading without diagnosis of hypertension   . Ulcerative colitis     Indeterminate quantiferon gold TB test, CXR normal.  Cleared for humira 06/2014.  Marland Kitchen Hypercholesteremia    Past Surgical History  Procedure Laterality Date  . Colonoscopy N/A 06/22/2014    Procedure: COLONOSCOPY;  Surgeon: Milus Banister, MD;  Location: [facility];  Service: Endoscopy;  Laterality: N/A;   History   Social History  . Marital Status: Married    Spouse Name: N/A    . Number of Children: 0  . Years of Education: N/A   Occupational History  . finance    Social History Main Topics  . Smoking status: Never Smoker   . Smokeless tobacco: Never Used  . Alcohol Use: Yes     Comment: 2-3 times weekly  . Drug Use: No  . Sexual Activity: Not on file   Other Topics Concern  . None   Social History Narrative   Married, no children.  Father healthy, sister healthy.  Mom d. Accidental overdose.   Education: Scientist, product/process development.   Occupation: Engineer, mining.   No tobacco.  Beer 2-3 times per week.  No drugs.   Exercises: lifting wt's and biking.   Family History  Problem Relation Age of Onset  . Heart attack Mother     per pt she had accidental overdose  . Colon cancer Neg Hx   . Colon polyps Father      Physical Exam: General: Well developed , well nourished, no acute distress Head: Normocephalic and atraumatic Eyes:  sclerae anicteric, EOMI Ears: Normal auditory acuity Mouth: No deformity or lesions Lungs: Clear throughout to auscultation Heart: Regular rate and rhythm; no murmurs, rubs or bruits Abdomen: Soft, non tender and non distended. No masses, hepatosplenomegaly or hernias noted. Normal Bowel sounds Musculoskeletal: Symmetrical with no gross deformities  Pulses:  Normal pulses noted Extremities: No clubbing, cyanosis, edema or deformities noted Neurological: Alert oriented x 4,  grossly nonfocal Psychological:  Alert and cooperative. Normal mood and affect  Assessment and Recommendations:  1. Ulcerative colitis. Stable 3 months on Humira. Continue Humira 40 mg subcutaneous every 14 days. He completed his Prednisone taper about 1 weeks. He had several questions about [place]-term management of his disease and [place]-term use of Humira. I spent 15 minutes with the patient and over 50% was spent counseling and coordinating care. REV in 3 months.

## 2015-01-01 ENCOUNTER — Ambulatory Visit (INDEPENDENT_AMBULATORY_CARE_PROVIDER_SITE_OTHER): Payer: No Typology Code available for payment source | Admitting: Family Medicine

## 2015-01-01 ENCOUNTER — Encounter: Payer: Self-pay | Admitting: Family Medicine

## 2015-01-01 VITALS — BP 140/98 | HR 79 | Temp 98.1°F | Resp 16 | Wt 210.0 lb

## 2015-01-01 DIAGNOSIS — M7022 Olecranon bursitis, left elbow: Secondary | ICD-10-CM | POA: Diagnosis not present

## 2015-01-01 MED ORDER — METHYLPREDNISOLONE ACETATE 40 MG/ML IJ SUSP
40.0000 mg | Freq: Once | INTRAMUSCULAR | Status: AC
Start: 2015-01-01 — End: 2015-01-01
  Administered 2015-01-01: 40 mg via INTRAMUSCULAR

## 2015-01-01 MED ORDER — LIDOCAINE HCL 1 % IJ SOLN
2.0000 mL | Freq: Once | INTRAMUSCULAR | Status: AC
  Administered 2015-01-01: 2 mL

## 2015-01-01 NOTE — Progress Notes (Signed)
OFFICE NOTE  01/01/2015  CC:  Chief Complaint  Patient presents with  . Elbow Pain    x 1 month ago   HPI: Patient is a 39 y.o. Caucasian male who is here for about 1 mo of pain in posterior aspect of left elbow, swelling as well.  Went down with icing once but recurred.  Rests elbows on desk all day at job. No other joints giving him problems.  No f/c/m  Pertinent PMH:  Ulcerative colitis.  MEDS:  Outpatient Prescriptions Prior to Visit  Medication Sig Dispense Refill  . Adalimumab (HUMIRA PEN) 40 MG/0.8ML PNKT Inject 40 mg into the skin every 14 (fourteen) days. 2 each 6   No facility-administered medications prior to visit.    PE: Blood pressure 159/91, pulse 79, temperature 98.1 F (36.7 C), temperature source Oral, resp. rate 16, weight 210 lb (95.255 kg), SpO2 100 %. BP repeat 140/98 Gen: Alert, well appearing.  Patient is oriented to person, place, time, and situation. Left elbow: moderately large, swollen/fluctuant, and erythematous olecranon bursa, with just minimal tenderness to palpation.  Elbow ROM intact.  No streaking or signs of any effusion in the elbow joint.  IMPRESSION AND PLAN:  Left olecranon bursitis. Relatively immunosuppressed on humira treatment for UC. Plan: aspirate fluid, inject steroid if fluid does not have appearance of infection. Send fluid for cell count/clx.  Procedure: Aspiration and steroid injection of left olecranon bursa: Sterile technique used, used 18 gauge 1 and 1/2 inch needle to enter bursa and got immediate return of serosanguinous fluid (approx 20 cc).  Bursa was drained completely, needle was left in and I applied the syringe containing 98m of 476mml depo medrol + 2 ml plain 1% lidocaine.  No immediate complications, pt tolerated procedure well.  Wound care instructions were given, elbow wrapped with gentle compression. Signs/symptoms to call or return for were reviewed and pt expressed understanding.  FOLLOW UP: prn

## 2015-01-01 NOTE — Addendum Note (Signed)
Addended by: Lanae Crumbly on: 01/01/2015 12:21 PM   Modules accepted: Orders

## 2015-01-01 NOTE — Progress Notes (Signed)
Pre visit review using our clinic review tool, if applicable. No additional management support is needed unless otherwise documented below in the visit note. 

## 2015-01-02 LAB — SYNOVIAL CELL COUNT + DIFF, W/ CRYSTALS
Crystals, Fluid: NONE SEEN
EOSINOPHILS-SYNOVIAL: 0 % (ref 0–1)
Lymphocytes-Synovial Fld: 39 % — ABNORMAL HIGH (ref 0–20)
MONOCYTE/MACROPHAGE: 5 % — AB (ref 50–90)
Neutrophil, Synovial: 56 % — ABNORMAL HIGH (ref 0–25)

## 2015-01-05 LAB — BODY FLUID CULTURE
Gram Stain: NONE SEEN
ORGANISM ID, BACTERIA: NO GROWTH

## 2015-01-21 ENCOUNTER — Ambulatory Visit: Payer: No Typology Code available for payment source | Admitting: Gastroenterology

## 2015-01-25 ENCOUNTER — Ambulatory Visit: Payer: No Typology Code available for payment source | Admitting: Gastroenterology

## 2015-03-11 ENCOUNTER — Telehealth: Payer: Self-pay | Admitting: Gastroenterology

## 2015-03-11 ENCOUNTER — Other Ambulatory Visit: Payer: Self-pay

## 2015-03-11 MED ORDER — ADALIMUMAB 40 MG/0.8ML ~~LOC~~ AJKT: 40.0000 mg | AUTO-INJECTOR | SUBCUTANEOUS | Status: DC

## 2015-03-11 NOTE — Telephone Encounter (Signed)
Called patient to verify his pharmacy had not changed so I could send his Humira. Patient states that [facility] got bought out by [facility]. The rx could still be sent to [facility]. Humira sent.

## 2015-03-13 ENCOUNTER — Ambulatory Visit (INDEPENDENT_AMBULATORY_CARE_PROVIDER_SITE_OTHER): Payer: 59 | Admitting: Gastroenterology

## 2015-03-13 ENCOUNTER — Encounter: Payer: Self-pay | Admitting: Gastroenterology

## 2015-03-13 ENCOUNTER — Other Ambulatory Visit (INDEPENDENT_AMBULATORY_CARE_PROVIDER_SITE_OTHER): Payer: 59

## 2015-03-13 ENCOUNTER — Other Ambulatory Visit: Payer: Self-pay

## 2015-03-13 VITALS — BP 130/90 | HR 80 | Ht 69.0 in | Wt 203.4 lb

## 2015-03-13 DIAGNOSIS — K51911 Ulcerative colitis, unspecified with rectal bleeding: Secondary | ICD-10-CM

## 2015-03-13 LAB — COMPREHENSIVE METABOLIC PANEL
ALBUMIN: 4.1 g/dL (ref 3.5–5.2)
ALK PHOS: 64 U/L (ref 39–117)
ALT: 13 U/L (ref 0–53)
AST: 15 U/L (ref 0–37)
BILIRUBIN TOTAL: 0.4 mg/dL (ref 0.2–1.2)
BUN: 14 mg/dL (ref 6–23)
CALCIUM: 9.2 mg/dL (ref 8.4–10.5)
CO2: 31 mEq/L (ref 19–32)
Chloride: 103 mEq/L (ref 96–112)
Creatinine, Ser: 1.02 mg/dL (ref 0.40–1.50)
GFR: 86.25 mL/min (ref >60.00)
GLUCOSE: 75 mg/dL (ref 70–99)
POTASSIUM: 4.1 meq/L (ref 3.5–5.1)
Sodium: 140 mEq/L (ref 135–145)
Total Protein: 7.6 g/dL (ref 6.0–8.3)

## 2015-03-13 LAB — CBC WITH DIFFERENTIAL/PLATELET
BASOS PCT: 0.7 % (ref 0.0–3.0)
Basophils Absolute: 0 10*3/uL (ref 0.0–0.1)
Eosinophils Absolute: 0.2 10*3/uL (ref 0.0–0.7)
Eosinophils Relative: 4.8 % (ref 0.0–5.0)
HCT: 41.1 % (ref 39.0–52.0)
Hemoglobin: 13.4 g/dL (ref 13.0–17.0)
LYMPHS ABS: 1.6 10*3/uL (ref 0.7–4.0)
Lymphocytes Relative: 43.5 % (ref 12.0–46.0)
MCHC: 32.7 g/dL (ref 30.0–36.0)
MCV: 78.3 fl (ref 78.0–100.0)
MONO ABS: 0.6 10*3/uL (ref 0.1–1.0)
MONOS PCT: 15.2 % — AB (ref 3.0–12.0)
NEUTROS PCT: 35.8 % — AB (ref 43.0–77.0)
Neutro Abs: 1.4 10*3/uL (ref 1.4–7.7)
Platelets: 304 10*3/uL (ref 150.0–400.0)
RBC: 5.25 Mil/uL (ref 4.22–5.81)
RDW: 16.3 % — AB (ref 11.5–15.5)
WBC: 3.8 10*3/uL — ABNORMAL LOW (ref 4.0–10.5)

## 2015-03-13 MED ORDER — ADALIMUMAB 40 MG/0.8ML ~~LOC~~ AJKT: 40.0000 mg | AUTO-INJECTOR | SUBCUTANEOUS | Status: DC

## 2015-03-13 NOTE — Patient Instructions (Signed)
Your physician has requested that you go to the basement for the following lab work before leaving today:CBC, Keith Jensen.   Continue your Humira.   Please follow up with Dr. Fuller Plan in 3 months.   Thank you for choosing me and [facility].  Pricilla Riffle. Dagoberto Ligas., MD., Marval Regal

## 2015-03-13 NOTE — Assessment & Plan Note (Addendum)
Doing very well on Humira since early 08/2014. CMET and CBC today. Continue Humira. Discussed [place]-term management of UC with Humira. Answered his questions. Follow up in 3 months.  15 minutes of face-to-face time spent with patient. Greater than 50% of time was spent counseling and coordinating care.

## 2015-03-13 NOTE — Progress Notes (Signed)
    History of Present Illness: This is a 39 year old male with ulcerative colitis. He was hospitalized in November 2015 for a severe flare with crampy abdominal pain and bloody diarrhea. He started Humira in January 2016. For the past several months he has no gastrointestinal complaints. He is not having any problems with his injections.  Current Medications, Allergies, Past Medical History, Past Surgical History, Family History and Social History were reviewed in [employer] record.  Physical Exam: General: Well developed , well nourished, no acute distress Head: Normocephalic and atraumatic Eyes:  sclerae anicteric, EOMI Ears: Normal auditory acuity Mouth: No deformity or lesions Lungs: Clear throughout to auscultation Heart: Regular rate and rhythm; no murmurs, rubs or bruits Abdomen: Soft, non tender and non distended. No masses, hepatosplenomegaly or hernias noted. Normal Bowel sounds Musculoskeletal: Symmetrical with no gross deformities  Pulses:  Normal pulses noted Extremities: No clubbing, cyanosis, edema or deformities noted Neurological: Alert oriented x 4, grossly nonfocal Psychological:  Alert and cooperative. Normal mood and affect  Assessment and Recommendations:

## 2015-03-21 ENCOUNTER — Telehealth: Payer: Self-pay | Admitting: Gastroenterology

## 2015-03-21 NOTE — Telephone Encounter (Signed)
Called [facility] to check on status of Humira prescription. [facility] states they received the prescription on 03/13/15 and she is not sure why is has not been set up for delivery. Informed her that is when we sent the prescription. She states she will contact the patient and set it up for delivery today. Verified home phone number for the patient. Called patient and notified him that [facility] will contact him today to set up delivery. Pt verbalized understanding.

## 2015-05-21 ENCOUNTER — Ambulatory Visit (HOSPITAL_COMMUNITY)
Admission: RE | Admit: 2015-05-21 | Discharge: 2015-05-21 | Disposition: A | Discharge status: Home / Self Care | Payer: 59 | Source: Ambulatory Visit | Attending: Family Medicine | Admitting: Family Medicine

## 2015-05-21 ENCOUNTER — Emergency Department (HOSPITAL_BASED_OUTPATIENT_CLINIC_OR_DEPARTMENT_OTHER)
Admission: EM | Admit: 2015-05-21 | Discharge: 2015-05-21 | Disposition: A | Discharge status: Home / Self Care | Payer: 59 | Attending: Emergency Medicine | Admitting: Emergency Medicine

## 2015-05-21 ENCOUNTER — Telehealth: Payer: Self-pay | Admitting: Gastroenterology

## 2015-05-21 ENCOUNTER — Ambulatory Visit (HOSPITAL_COMMUNITY): Admission: RE | Admit: 2015-05-21 | Payer: 59 | Source: Ambulatory Visit

## 2015-05-21 ENCOUNTER — Encounter (HOSPITAL_BASED_OUTPATIENT_CLINIC_OR_DEPARTMENT_OTHER): Payer: Self-pay | Admitting: *Deleted

## 2015-05-21 ENCOUNTER — Ambulatory Visit (INDEPENDENT_AMBULATORY_CARE_PROVIDER_SITE_OTHER): Payer: 59 | Admitting: Family Medicine

## 2015-05-21 ENCOUNTER — Emergency Department (HOSPITAL_BASED_OUTPATIENT_CLINIC_OR_DEPARTMENT_OTHER): Payer: 59

## 2015-05-21 ENCOUNTER — Encounter: Payer: Self-pay | Admitting: Family Medicine

## 2015-05-21 ENCOUNTER — Other Ambulatory Visit: Payer: Self-pay | Admitting: Family Medicine

## 2015-05-21 VITALS — BP 139/95 | HR 112 | Temp 99.2°F | Resp 16 | Ht 69.0 in | Wt 201.0 lb

## 2015-05-21 DIAGNOSIS — R220 Localized swelling, mass and lump, head: Secondary | ICD-10-CM

## 2015-05-21 DIAGNOSIS — R1310 Dysphagia, unspecified: Secondary | ICD-10-CM | POA: Insufficient documentation

## 2015-05-21 DIAGNOSIS — Z792 Long term (current) use of antibiotics: Secondary | ICD-10-CM | POA: Insufficient documentation

## 2015-05-21 DIAGNOSIS — Z8719 Personal history of other diseases of the digestive system: Secondary | ICD-10-CM | POA: Diagnosis not present

## 2015-05-21 DIAGNOSIS — Z8639 Personal history of other endocrine, nutritional and metabolic disease: Secondary | ICD-10-CM | POA: Diagnosis not present

## 2015-05-21 DIAGNOSIS — R630 Anorexia: Secondary | ICD-10-CM | POA: Insufficient documentation

## 2015-05-21 DIAGNOSIS — J029 Acute pharyngitis, unspecified: Secondary | ICD-10-CM | POA: Insufficient documentation

## 2015-05-21 DIAGNOSIS — K1120 Sialoadenitis, unspecified: Secondary | ICD-10-CM

## 2015-05-21 DIAGNOSIS — M542 Cervicalgia: Secondary | ICD-10-CM | POA: Insufficient documentation

## 2015-05-21 DIAGNOSIS — R221 Localized swelling, mass and lump, neck: Secondary | ICD-10-CM

## 2015-05-21 DIAGNOSIS — R509 Fever, unspecified: Secondary | ICD-10-CM | POA: Diagnosis not present

## 2015-05-21 DIAGNOSIS — R070 Pain in throat: Secondary | ICD-10-CM | POA: Diagnosis present

## 2015-05-21 LAB — CBC WITH DIFFERENTIAL/PLATELET
BASOS ABS: 0 10*3/uL (ref 0.0–0.1)
Basophils Relative: 0 % (ref 0–1)
EOS ABS: 0.1 10*3/uL (ref 0.0–0.7)
Eosinophils Relative: 1 % (ref 0–5)
HCT: 44.2 % (ref 39.0–52.0)
Hemoglobin: 14.6 g/dL (ref 13.0–17.0)
LYMPHS ABS: 1.8 10*3/uL (ref 0.7–4.0)
Lymphocytes Relative: 13 % (ref 12–46)
MCH: 26.3 pg (ref 26.0–34.0)
MCHC: 33 g/dL (ref 30.0–36.0)
MCV: 79.6 fL (ref 78.0–100.0)
MONOS PCT: 13 % — AB (ref 3–12)
MPV: 12 fL (ref 8.6–12.4)
Monocytes Absolute: 1.8 10*3/uL — ABNORMAL HIGH (ref 0.1–1.0)
Neutro Abs: 9.9 10*3/uL — ABNORMAL HIGH (ref 1.7–7.7)
Neutrophils Relative %: 73 % (ref 43–77)
PLATELETS: 292 10*3/uL (ref 150–400)
RBC: 5.55 MIL/uL (ref 4.22–5.81)
RDW: 17.6 % — ABNORMAL HIGH (ref 11.5–15.5)
WBC: 13.6 10*3/uL — ABNORMAL HIGH (ref 4.0–10.5)

## 2015-05-21 LAB — BASIC METABOLIC PANEL
Anion gap: 8 (ref 5–15)
BUN: 12 mg/dL (ref 6–20)
CALCIUM: 8.9 mg/dL (ref 8.9–10.3)
CO2: 27 mmol/L (ref 22–32)
CREATININE: 0.99 mg/dL (ref 0.61–1.24)
Chloride: 100 mmol/L — ABNORMAL LOW (ref 101–111)
GFR calc Af Amer: >60 mL/min (ref >60)
Glucose, Bld: 99 mg/dL (ref 65–99)
POTASSIUM: 3.9 mmol/L (ref 3.5–5.1)
SODIUM: 135 mmol/L (ref 135–145)

## 2015-05-21 MED ORDER — METRONIDAZOLE 500 MG PO TABS: 500.0000 mg | ORAL_TABLET | Freq: Three times a day (TID) | ORAL | Status: DC

## 2015-05-21 MED ORDER — PREDNISONE 10 MG (21) PO TBPK: ORAL_TABLET | ORAL | Status: DC

## 2015-05-21 MED ORDER — DEXAMETHASONE SODIUM PHOSPHATE 10 MG/ML IJ SOLN
10.0000 mg | Freq: Once | INTRAMUSCULAR | Status: AC
  Administered 2015-05-21: 10 mg via INTRAVENOUS
  Filled 2015-05-21: qty 1

## 2015-05-21 MED ORDER — CLINDAMYCIN PHOSPHATE 600 MG/50ML IV SOLN
600.0000 mg | Freq: Once | INTRAVENOUS | Status: AC
  Administered 2015-05-21: 600 mg via INTRAVENOUS
  Filled 2015-05-21: qty 50

## 2015-05-21 MED ORDER — MORPHINE SULFATE (PF) 4 MG/ML IV SOLN
4.0000 mg | Freq: Once | INTRAVENOUS | Status: AC
  Administered 2015-05-21: 4 mg via INTRAVENOUS
  Filled 2015-05-21: qty 1

## 2015-05-21 MED ORDER — ACETAMINOPHEN 500 MG PO TABS
1000.0000 mg | ORAL_TABLET | Freq: Once | ORAL | Status: AC
  Administered 2015-05-21: 1000 mg via ORAL
  Filled 2015-05-21: qty 2

## 2015-05-21 MED ORDER — DOXYCYCLINE HYCLATE 100 MG PO CAPS: 100.0000 mg | ORAL_CAPSULE | Freq: Two times a day (BID) | ORAL | Status: DC

## 2015-05-21 MED ORDER — IOHEXOL 300 MG/ML  SOLN
75.0000 mL | Freq: Once | INTRAMUSCULAR | Status: AC | PRN
  Administered 2015-05-21: 75 mL via INTRAVENOUS

## 2015-05-21 NOTE — ED Notes (Signed)
Throat pain. He was seen at [facility] earlier today and told he probable has an abscess and he should come to the ED if he gets worse.

## 2015-05-21 NOTE — ED Notes (Signed)
Pt. Saw his PMD today and has worries of a throat abcess.  Pt. Now here in ED due to need for poss. meds.

## 2015-05-21 NOTE — Telephone Encounter (Signed)
Patient states he had lymph node swelling in his neck and went to Urgent Care yesterday. He was placed on antibiotics. Feels is worse. States having difficulty swallowing.  Throat is not sore.  He is not having problems breathing. Patient instructed to go to ED for evaluation. He is told to call [phone] if breathing is difficult.

## 2015-05-21 NOTE — Discharge Instructions (Signed)
You have been seen for a submandibular gland infection. Your consult physician from ENT was Dr. Simeon Craft. You may followup with his office, but only if symptoms do not resolve. You have been given an antibiotic and a steroid. You are to take both in their entirety.  Return to ED should swelling affect your breathing or worsen your swallowing.

## 2015-05-21 NOTE — Patient Instructions (Signed)
I am worried you have an abscess of your throat after your dental procedure.  I need to get some blood work today and a CT scan of your neck.  I have called in an additional medication for you to start along with the Augmentin.  I will call you tomorrow with results. If it is an abscess you may need to be admitted to receive IV antibiotics.  If for any reason your symptoms worsen or you have trouble swallowing, then I want you to go to ED immediately.

## 2015-05-21 NOTE — Progress Notes (Signed)
Subjective:    Patient ID: Keith Jensen, male    DOB: May 16, 1976, 39 y.o.   MRN: [id number]  HPI  Sore throat: Patient presents for an acute visit with sore throat/neck with bilateral swollen tender lymph nodes of his neck that started on Sunday, 2 days ago. Patient states [facility] and was given Augmentin for his throat. He reports they performed a strep test which was negative. Patient denies any fever, chills, ear pain, eye pain, rhinorrhea, facial pressure or cough. Patient states that he has very mild congestion, but nothing out of the ordinary for him. He denies being around any sick contacts. He reports that he did go to the dentist on Thursday and had a "deep cleaning ". He denies any gum line tenderness, erythema, ulcerations or drainage. Patient is on Humira for ulcerative colitis. His last dose of Humira was Saturday, just prior to the onset of his swollen lymph glands.  Patient does admit to a dry mouth, difficulty speaking secondary to pain with opening his mouth, mild difficulty swallowing therefore only drinking ensure instead of eating. He endorses increased fatigue. He denies involuntary drooling.   Dentist: Dr. Gordy Levan, [address] [place].   Past Medical History  Diagnosis Date  . 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  [facility] notes state that since 2001 he had multiple episodes of proctitis, proctoscopy done 2005 but biopsies not performed, then pt referred to GI and diagnostic colonoscopy was recommended but pt failed to schedule this b/c his bleeding resolved (most recent GI visit as per their old notes is 01/18/2012, with Dr. Wynetta Emery)  . Elevated blood pressure reading without diagnosis of hypertension   . Ulcerative colitis ([place])     Indeterminate quantiferon gold TB test, CXR normal.  Cleared for humira 06/2014.  Marland Kitchen Hypercholesteremia    Allergies  Allergen Reactions  . Acyclovir And Related     Social History   Social History  . Marital Status: Married    Spouse Name: N/A  . Number of Children: 0  . Years of Education: N/A   Occupational History  . finance    Social History Main Topics  . Smoking status: Never Smoker   . Smokeless tobacco: Never Used  . Alcohol Use: Yes     Comment: 2-3 times weekly  . Drug Use: No  . Sexual Activity: Not on file   Other Topics Concern  . Not on file   Social History Narrative   Married, no children.  Father healthy, sister healthy.  Mom d. Accidental overdose.   Education: Scientist, product/process development.   Occupation: Engineer, mining.   No tobacco.  Beer 2-3 times per week.  No drugs.   Exercises: lifting wt's and biking.   Review of Systems Negative, with the exception of above mentioned in HPI      Objective:   Physical Exam BP 139/95 mmHg  Pulse 112  Temp(Src) 99.2 F (37.3 C) (Oral)  Resp 16  Ht 5' 9"  (1.753 m)  Wt 201 lb (91.173 kg)  BMI 29.67 kg/m2  SpO2 100% Gen: Afebrile. No acute distress. Nontoxic in appearance, well-developed, well-nourished Caucasian male. Very pleasant. HENT: AT. [place]. Bilateral TM visualized and normal in appearance. MMM. Bilateral nares without erythema or swelling. Throat without erythema or exudates. No oral lesions, no erythema, dental/gum abscess or erythema. No drainage. Nontender teeth. No uvula swelling. Posterior oropharyngeal swelling present, right  greater than left. Eyes:Pupils Equal Round Reactive to light, Extraocular movements intact,  Conjunctiva without redness, discharge or icterus. Neck/lymp/endocrine: Supple, large bilateral submandibular lymphadenopathy present with tenderness to palpation, no thyromegaly, trachea midline. Phonation difficulty.  CV: Tachycardic, regular rhythm Chest: CTAB, no wheeze or crackles Abd: Soft. Round. NTND. BS Present.  Skin: no rashes, purpura or petechiae.  Neuro: Normal gait. PERLA. EOMi. Alert. Oriented x 3 no focal deficits.      Assessment &  Plan:  1. Submandibular gland infection - Patient is to continue Augmentin, will add metronidazole and obtain stat imaging, concern for abscess formation versus cellulitis of submandibular bilaterally. - Discussed in great detail with patient my concern and being tachycardic, degree of swelling and difficulty swallowing, along with recent deep cleaning at his dentist and being on an immune modulator. Discussed there is a possibility patient will need IV antibiotics, possible admission versus ENT referral, depending on response to antibiotics and imaging studies. Patient is in understanding. patient encouraged to go to the emergency room if any symptoms increase, he has difficulty swallowing or pain increases.  - metroNIDAZOLE (FLAGYL) 500 MG tablet; Take 1 tablet (500 mg total) by mouth 3 (three) times daily.  Dispense: 21 tablet; Refill: 0 - CT Soft Tissue Neck W Contrast;--> insurance would not cover CT, ordered stat MRI of neck. - CBC w/Diff - Plan dependent on imaging studies.

## 2015-05-21 NOTE — Progress Notes (Signed)
Pre visit review using our clinic review tool, if applicable. No additional management support is needed unless otherwise documented below in the visit note. 

## 2015-05-21 NOTE — Telephone Encounter (Signed)
Needs evaluation by his PCP, urgent care or ED

## 2015-05-21 NOTE — ED Provider Notes (Signed)
CSN: [id number]     Arrival date & time 05/21/15  1943 History   First MD Initiated Contact with Patient 05/21/15 1955     Chief Complaint  Patient presents with  . Throat Pain      (Consider location/radiation/quality/duration/timing/severity/associated sxs/prior Treatment) HPI  Keith Jensen is a 39 y.o. male who presents with throat pain for two days. Pain is bilateral and submandibular, does not radiate and rates it at 8/10.  Pt has been unable to swallow solid food since onset. Pt saw his PCP, Howard Pouch, DO, today and was told that he may have an abscess. A CT was recommended, but pt states his insurance would not cover a CT so an MRI was done instead.  Pt states he didn't finish the imaging study due to pain. Pt was also prescribed 2 ABX, Amoxicillin and Flagyl. He took one dose of Amoxicillin, but the Flagyl was sent to the wrong pharmacy.  Pt denies shortness of breath, N/V, or any other pain.  States he feels febrile.  10:21 PM Pt adds information to his story. Pt states on Thursday of last week (5 days ago), he had a deep cavity in one of his teeth that one dentist told him would need a root canal, which pt refused. Pt went to another dentist who agreed to fill it.  Pt had originally said that this dentist visit only included a "deep cleaning."  Past Medical History  Diagnosis Date  . 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  [facility] notes state that since 2001 he had multiple episodes of proctitis, proctoscopy done 2005 but biopsies not performed, then pt referred to GI and diagnostic colonoscopy was recommended but pt failed to schedule this b/c his bleeding resolved (most recent GI visit as per their old notes is 01/18/2012, with Dr. Wynetta Emery)  . Elevated blood pressure reading without diagnosis of hypertension   . Ulcerative colitis ([place])     Indeterminate quantiferon gold TB test, CXR normal.  Cleared for humira 06/2014.  Marland Kitchen  Hypercholesteremia    Past Surgical History  Procedure Laterality Date  . Colonoscopy N/A 06/22/2014    Procedure: COLONOSCOPY;  Surgeon: Milus Banister, MD;  Location: [facility];  Service: Endoscopy;  Laterality: N/A;   Family History  Problem Relation Age of Onset  . Heart attack Mother     per pt she had accidental overdose  . Colon cancer Neg Hx   . Colon polyps Father    Social History  Substance Use Topics  . Smoking status: Never Smoker   . Smokeless tobacco: Never Used  . Alcohol Use: Yes     Comment: 2-3 times weekly    Review of Systems  Constitutional: Positive for fever, chills and appetite change. Negative for diaphoresis and unexpected weight change.  HENT: Positive for sore throat and trouble swallowing. Negative for voice change.   Respiratory: Negative for chest tightness, shortness of breath and stridor.   Cardiovascular: Negative for chest pain.      Allergies  Acyclovir and related  Home Medications   Prior to Admission medications   Medication Sig Start Date End Date Taking? Authorizing Provider  Adalimumab (HUMIRA PEN) 40 MG/0.8ML PNKT Inject 40 mg into the skin every 14 (fourteen) days. 03/13/15   Ladene Artist, MD  AMOXICILLIN PO Take 875 mg by mouth 2 (two) times daily.     Historical Provider, MD  doxycycline (VIBRAMYCIN) 100  MG capsule Take 1 capsule (100 mg total) by mouth 2 (two) times daily. 05/21/15   Takisha Pelle C Aliz Meritt, PA-C  metroNIDAZOLE (FLAGYL) 500 MG tablet Take 1 tablet (500 mg total) by mouth 3 (three) times daily. 05/21/15   Renee A Kuneff, DO  predniSONE (STERAPRED UNI-PAK 21 TAB) 10 MG (21) TBPK tablet Take 3 tablets daily for two days, then 2 tablets daily for two days, then 1 tablet daily for two days 05/21/15   Shai Rasmussen C Markesha Hannig, PA-C   BP 159/105 mmHg  Pulse 104  Temp(Src) 100.2 F (37.9 C) (Oral)  Resp 18  Ht 5' 10"  (1.778 m)  Wt 201 lb (91.173 kg)  BMI 28.84 kg/m2  SpO2 99% Physical Exam  Constitutional: He appears  well-developed and well-nourished. No distress.  HENT:  Head: Normocephalic and atraumatic.  Mouth/Throat: Uvula is midline, oropharynx is clear and moist and mucous membranes are normal. No oral lesions. No dental abscesses or uvula swelling. No oropharyngeal exudate, posterior oropharyngeal edema, posterior oropharyngeal erythema or tonsillar abscesses.  Eyes: Conjunctivae are normal. Pupils are equal, round, and reactive to light.  Neck: Normal range of motion and phonation normal. Neck supple. Normal carotid pulses present. Tracheal tenderness and muscular tenderness present.    Bilateral submandibular firm masses and swelling. Trachea midline.   Cardiovascular: Normal rate and regular rhythm.   Pulmonary/Chest: Effort normal and breath sounds normal. No accessory muscle usage or stridor. No tachypnea and no bradypnea. No respiratory distress.  Speaks in full sentences. No "hot potato" voice.   Neurological: He is alert.  Skin: Skin is dry. He is not diaphoretic.  Nursing note and vitals reviewed.   ED Course  Procedures (including critical care time) Labs Review Labs Reviewed  BASIC METABOLIC PANEL - Abnormal; Notable for the following:    Chloride 100 (*)    All other components within normal limits    Imaging Review Ct Soft Tissue Neck W Contrast  05/21/2015   CLINICAL DATA:  39 year old male with difficulty swallowing and soft tissue swelling under the chin in. Initial encounter.  EXAM: CT NECK WITH CONTRAST  TECHNIQUE: Multidetector CT imaging of the neck was performed using the standard protocol following the bolus administration of intravenous contrast.  CONTRAST:  56m OMNIPAQUE IOHEXOL 300 MG/ML  SOLN  COMPARISON:  None.  FINDINGS: Pharynx and larynx: Larynx is within normal limits. Mild motion artifact in the hypopharynx. Pharyngeal contours are within normal limits; symmetric appearing palatine tonsil enlargement. Negative parapharyngeal and retropharyngeal spaces.   Salivary glands: Enlarged and inflamed bilateral submandibular glands with heterogeneous enhancement submandibular soft tissue stranding tracking inferiorly and extending across the platysma to the subcutaneous soft tissues. Sublingual space remains within normal limits. No level 1 lymphadenopathy. No sublingual or submandibular sialolithiasis identified.  Parotid glands are within normal limits.  Thyroid: Negative.  Lymph nodes: No level 1 lymphadenopathy. Mild level 2 lymph node enlargement, reactive appearing. No cystic or necrotic nodes. Other cervical lymph node stations within normal limits.  Soft tissue inflammation tracking caudally from the submandibular space to include the subcutaneous fat anterior to the thyroid and thoracic inlet. No fluid collection.  Vascular: Major vascular structures in the neck and at the skullbase are patent.  Limited intracranial: Negative.  Visualized orbits: Negative.  Mastoids and visualized paranasal sinuses: Mild left maxillary sinus mucosal thickening. Other Visualized paranasal sinuses and mastoids are clear.  Skeleton: No acute osseous abnormality identified. No acute dental findings identified.  Upper chest: Negative lung apices.  Negative superior mediastinum.  IMPRESSION: 1. Inflamed bilateral submandibular glands with overlying soft tissue inflammation/ cellulitis tracking inferiorly in the midline. No sialolithiasis. No fluid collection. Favor infectious sialoadenitis. 2. Mild, reactive level 2 lymphadenopathy.   Electronically Signed   By: Genevie Ann M.D.   On: 05/21/2015 22:03   I have personally reviewed and evaluated these images and lab results as part of my medical decision-making.   EKG Interpretation None      MDM   Final diagnoses:  Head or neck swelling, mass, or lump    Keith Jensen presents with pain in his throat suspicious for a submandibular abscess.   Findings and plan of care discussed with Veryl Speak, MD.  Pt to receive neck CT and  will be treated based on findings. BMP shows no abnormalities. CrCl normal.  CT shows bilaterally inflamed submandibular glands with overlying soft tissue inflammation/cellulitis. Pt is not toxic or ill-appearing.  Pt is not stridorous.  Dr. Stark Jock consulted with Dr. Simeon Craft, ENT, via phone. Recommends Decadron, IV Clindamycin, and three weeks of Doxycycline PO at home with a Prednisone taper.  Pt to be discharged home with instructions to call Dr. Theressa Millard office should symptoms fail to resolve.  Pt asked for some tylenol before leaving.  Lorayne Bender, PA-C 05/21/15 2242  Veryl Speak, MD 05/21/15 2248

## 2015-05-22 ENCOUNTER — Encounter: Payer: Self-pay | Admitting: Family Medicine

## 2015-05-22 ENCOUNTER — Other Ambulatory Visit: Payer: Self-pay | Admitting: Family Medicine

## 2015-05-22 ENCOUNTER — Telehealth: Payer: Self-pay | Admitting: Family Medicine

## 2015-05-22 DIAGNOSIS — K1120 Sialoadenitis, unspecified: Secondary | ICD-10-CM

## 2015-05-22 MED ORDER — METRONIDAZOLE 500 MG PO TABS: 500.0000 mg | ORAL_TABLET | Freq: Three times a day (TID) | ORAL | Status: DC

## 2015-05-22 NOTE — Telephone Encounter (Signed)
Please call patient and check on to see how he is doing. I see that he did eventually go to the emergency room last night and received IV antibiotics and a prescription for steroids and doxycycline, these of the medications he should continue. - His CBC was collected yesterday was elevated and a white count, suggesting the acute infection of his glands that we suspected. - Unable to tell by the emergency room physician's note, if patient does have follow-up scheduled with ENT. If he does not I would like him to follow-up here prior to the weekend if possible so that we can ensure that he is improving. - If he has any questions do not hesitate to call.

## 2015-05-22 NOTE — Telephone Encounter (Signed)
Pt aware of results.  Pt does have an ENT scheduled.  Pt states he is doing much better and his glands have already started to go down.

## 2015-06-17 ENCOUNTER — Encounter: Payer: Self-pay | Admitting: Family Medicine

## 2015-06-17 ENCOUNTER — Ambulatory Visit (INDEPENDENT_AMBULATORY_CARE_PROVIDER_SITE_OTHER): Payer: 59 | Admitting: Family Medicine

## 2015-06-17 VITALS — BP 120/94 | HR 92 | Temp 97.9°F | Resp 16 | Ht 69.0 in | Wt 205.0 lb

## 2015-06-17 DIAGNOSIS — R030 Elevated blood-pressure reading, without diagnosis of hypertension: Secondary | ICD-10-CM

## 2015-06-17 DIAGNOSIS — K1120 Sialoadenitis, unspecified: Secondary | ICD-10-CM | POA: Diagnosis not present

## 2015-06-17 MED ORDER — AMOXICILLIN-POT CLAVULANATE 875-125 MG PO TABS: 1.0000 | ORAL_TABLET | Freq: Two times a day (BID) | ORAL | Status: DC

## 2015-06-17 NOTE — Progress Notes (Signed)
OFFICE VISIT  06/17/2015   CC:  Chief Complaint  Patient presents with  . Follow-up    soft tissue inflammation/cellulitis of throat. He stated that it was improving but he did not take the antibiotic like directed and now the symtoms are returning.     HPI:    Patient is a 39 y.o. Caucasian male who presents for recent days swelling in bilat parotid gland and submandib gland regions with mild pain in the regions. He had this similar illness about a month ago and was rx'd abx and he took it about a week and felt completely improved so stopped the abx.  He has continued to take his humira except his most recent dose which was due 2 d/a.  No fever.  No throat pain inside.   He recently restarted flagyl and amoxil 3 d/a when sx's restarted and he has very little of the amoxil left.  ROS: no HA, no dizziness, no CP or SOB  Past Medical History  Diagnosis Date  . 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  [facility] notes state that since 2001 he had multiple episodes of proctitis, proctoscopy done 2005 but biopsies not performed, then pt referred to GI and diagnostic colonoscopy was recommended but pt failed to schedule this b/c his bleeding resolved (most recent GI visit as per their old notes is 01/18/2012, with Dr. Wynetta Emery)  . Elevated blood pressure reading without diagnosis of hypertension   . Ulcerative colitis ([place])     Indeterminate quantiferon gold TB test, CXR normal.  Cleared for humira 06/2014.  Marland Kitchen Hypercholesteremia     Past Surgical History  Procedure Laterality Date  . Colonoscopy N/A 06/22/2014    Procedure: COLONOSCOPY;  Surgeon: Milus Banister, MD;  Location: [facility];  Service: Endoscopy;  Laterality: N/A;    Outpatient Prescriptions Prior to Visit  Medication Sig Dispense Refill  . Adalimumab (HUMIRA PEN) 40 MG/0.8ML PNKT Inject 40 mg into the skin every 14 (fourteen) days. 6 each 1  . AMOXICILLIN PO Take 875 mg by  mouth 2 (two) times daily.     Marland Kitchen doxycycline (VIBRAMYCIN) 100 MG capsule Take 1 capsule (100 mg total) by mouth 2 (two) times daily. (Patient not taking: Reported on 06/17/2015) 28 capsule 0  . metroNIDAZOLE (FLAGYL) 500 MG tablet Take 1 tablet (500 mg total) by mouth 3 (three) times daily. (Patient not taking: Reported on 06/17/2015) 21 tablet 0  . predniSONE (STERAPRED UNI-PAK 21 TAB) 10 MG (21) TBPK tablet Take 3 tablets daily for two days, then 2 tablets daily for two days, then 1 tablet daily for two days (Patient not taking: Reported on 06/17/2015) 13 tablet 0   No facility-administered medications prior to visit.    Allergies  Allergen Reactions  . Acyclovir And Related     ROS As per HPI  PE: Blood pressure 120/94, pulse 92, temperature 97.9 F (36.6 C), temperature source Oral, resp. rate 16, height 5' 9"  (1.753 m), weight 205 lb (92.987 kg), SpO2 100 %. BP recheck 120/94 Gen: Alert, well appearing.  Patient is oriented to person, place, time, and situation. ENT: Ears: EACs clear, normal epithelium.  TMs with good light reflex and landmarks bilaterally.  Eyes: no injection, icteris, swelling, or exudate.  EOMI, PERRLA. Nose: no drainage or turbinate edema/swelling.  No injection or focal lesion.  Mouth: lips without lesion/swelling.  Oral mucosa pink and moist.  Dentition intact and without obvious  caries or gingival swelling.  Oropharynx without erythema, exudate, or swelling.  Parotids w/out swelling or TTP. Bilat, symmetric submandibular gland enlargement and mildly TTP.   No signif cervical LAD palpable.  LABS:  none  IMPRESSION AND PLAN:  1) Sialadenitis of submandibular glands and parotids. Recurrent vs reactivation of inadequately treated infection from a few weeks ago. Stop metronidazole. Start augmentin 820m bid x 14d. He may take his humira injection, as I believe the benefits of this med keeping his UC under control outweigh potential risk of infection  complication at this time.  2) Elev bp w/out dx of HTN: obtain home bp cuff and check bp at home q1-2 days for 1 mo and call or return to our office to discuss.  An After Visit Summary was printed and given to the patient.   FOLLOW UP: Return if symptoms worsen or fail to improve.

## 2015-06-17 NOTE — Progress Notes (Signed)
Pre visit review using our clinic review tool, if applicable. No additional management support is needed unless otherwise documented below in the visit note. 

## 2015-09-09 ENCOUNTER — Encounter: Payer: Self-pay | Admitting: Family Medicine

## 2015-11-08 ENCOUNTER — Telehealth: Payer: Self-pay

## 2015-11-08 NOTE — Telephone Encounter (Signed)
Patient declined flu vaccine.

## 2015-12-19 ENCOUNTER — Telehealth: Payer: Self-pay | Admitting: Gastroenterology

## 2015-12-19 DIAGNOSIS — K51918 Ulcerative colitis, unspecified with other complication: Secondary | ICD-10-CM

## 2015-12-19 MED ORDER — DICYCLOMINE HCL 10 MG PO CAPS: 10.0000 mg | ORAL_CAPSULE | Freq: Four times a day (QID) | ORAL | Status: DC | PRN

## 2015-12-19 NOTE — Telephone Encounter (Signed)
Left message for patient to call back  

## 2015-12-19 NOTE — Telephone Encounter (Signed)
Patient report that he has had some abdominal cramping for the last several days and had one episode of blood in the stool and it was bright red.  He is maintained on Humira 40 mg q 2 weeks for UC.  He is concerned this is the start of a flare and wonders if he should start on prednisone.  He denies diarrhea, nausea, or vomiting.  Dr. Silverio Decamp you are MD of the day.  Please review and advise

## 2015-12-19 NOTE — Telephone Encounter (Signed)
Given no diarrhea, less likely to be a UC flare or c. Diff colitis. Please advise patient to do CBC and CMP. Trial of PO Bentyl 12m q6h PRN. If he continues to have significant abdominal cramping and pain, will consider CT abd & pelvis to evaluate. Follow up in office visit.

## 2015-12-20 ENCOUNTER — Other Ambulatory Visit (INDEPENDENT_AMBULATORY_CARE_PROVIDER_SITE_OTHER): Payer: 59

## 2015-12-20 DIAGNOSIS — K51918 Ulcerative colitis, unspecified with other complication: Secondary | ICD-10-CM | POA: Diagnosis not present

## 2015-12-20 LAB — CBC WITH DIFFERENTIAL/PLATELET
BASOS ABS: 0 10*3/uL (ref 0.0–0.1)
BASOS PCT: 0.4 % (ref 0.0–3.0)
EOS ABS: 0.3 10*3/uL (ref 0.0–0.7)
Eosinophils Relative: 4 % (ref 0.0–5.0)
HCT: 44.4 % (ref 39.0–52.0)
HEMOGLOBIN: 15 g/dL (ref 13.0–17.0)
Lymphocytes Relative: 27.5 % (ref 12.0–46.0)
Lymphs Abs: 1.8 10*3/uL (ref 0.7–4.0)
MCHC: 33.7 g/dL (ref 30.0–36.0)
MCV: 86.6 fl (ref 78.0–100.0)
MONO ABS: 0.7 10*3/uL (ref 0.1–1.0)
Monocytes Relative: 10.7 % (ref 3.0–12.0)
Neutro Abs: 3.7 10*3/uL (ref 1.4–7.7)
Neutrophils Relative %: 57.4 % (ref 43.0–77.0)
Platelets: 297 10*3/uL (ref 150.0–400.0)
RBC: 5.13 Mil/uL (ref 4.22–5.81)
RDW: 14.7 % (ref 11.5–15.5)
WBC: 6.4 10*3/uL (ref 4.0–10.5)

## 2015-12-20 LAB — COMPREHENSIVE METABOLIC PANEL
ALBUMIN: 4.5 g/dL (ref 3.5–5.2)
ALT: 16 U/L (ref 0–53)
AST: 14 U/L (ref 0–37)
Alkaline Phosphatase: 76 U/L (ref 39–117)
BUN: 12 mg/dL (ref 6–23)
CHLORIDE: 100 meq/L (ref 96–112)
CO2: 28 meq/L (ref 19–32)
CREATININE: 0.95 mg/dL (ref 0.40–1.50)
Calcium: 9.2 mg/dL (ref 8.4–10.5)
GFR: 93.25 mL/min (ref >60.00)
Glucose, Bld: 103 mg/dL — ABNORMAL HIGH (ref 70–99)
Potassium: 4 mEq/L (ref 3.5–5.1)
SODIUM: 136 meq/L (ref 135–145)
Total Bilirubin: 0.4 mg/dL (ref 0.2–1.2)
Total Protein: 8.4 g/dL — ABNORMAL HIGH (ref 6.0–8.3)

## 2015-12-20 NOTE — Telephone Encounter (Signed)
Patient notified he will come for labs.  He is scheduled for follow up 12/24/15

## 2015-12-23 ENCOUNTER — Telehealth: Payer: Self-pay | Admitting: Gastroenterology

## 2015-12-23 NOTE — Telephone Encounter (Signed)
Symptoms are about the same.  He will be out of town on [place] for work .  He will come in on 01/01/16 3:00

## 2015-12-23 NOTE — Telephone Encounter (Signed)
Appointment not rescheduled. He doesn't think he can wait that [place] to be seen. He is requesting a callback.

## 2015-12-25 ENCOUNTER — Ambulatory Visit: Payer: 59 | Admitting: Gastroenterology

## 2016-01-01 ENCOUNTER — Ambulatory Visit (INDEPENDENT_AMBULATORY_CARE_PROVIDER_SITE_OTHER): Payer: 59 | Admitting: Gastroenterology

## 2016-01-01 ENCOUNTER — Encounter: Payer: Self-pay | Admitting: Gastroenterology

## 2016-01-01 VITALS — BP 124/80 | HR 68 | Ht 68.0 in | Wt 206.0 lb

## 2016-01-01 DIAGNOSIS — K5100 Ulcerative (chronic) pancolitis without complications: Secondary | ICD-10-CM | POA: Diagnosis not present

## 2016-01-01 NOTE — Patient Instructions (Signed)
Thank you for choosing me and [facility].  Pricilla Riffle. Dagoberto Ligas., MD., Marval Regal

## 2016-01-01 NOTE — Progress Notes (Signed)
    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 He has been maintained on Humira since 08/2014. He has not returned for recommended follow up appts. Last office appt in 03/2015 and he was due to follow up in 06/2015. Had abd cramping and minor bleeding about 2 weeks that resolved in 3-4 days. CBC, CMP were ok. He was advised to take Bentyl however he decided to use Lialda for 2 weeks. Noted looser stools while on Lialda. No GI complaints for past 10 days.   Current Medications, Allergies, Past Medical History, Past Surgical History, Family History and Social History were reviewed in [employer] record.  Physical Exam: General: Well developed, well nourished, no acute distress Head: Normocephalic and atraumatic Eyes:  sclerae anicteric, EOMI Ears: Normal auditory acuity Mouth: No deformity or lesions Lungs: Clear throughout to auscultation Heart: Regular rate and rhythm; no murmurs, rubs or bruits Abdomen: Soft, non tender and non distended. No masses, hepatosplenomegaly or hernias noted. Normal Bowel sounds Musculoskeletal: Symmetrical with no gross deformities  Pulses:  Normal pulses noted Extremities: No clubbing, cyanosis, edema or deformities noted Neurological: Alert oriented x 4, grossly nonfocal Psychological:  Alert and cooperative. Normal mood and affect  Assessment and Recommendations:

## 2016-01-01 NOTE — Assessment & Plan Note (Addendum)
Doing very well on Humira. Possible mild flare 2 weeks ago. Continue Humira. Remain off Lialda. Discussed [place]-term management of UC with Humira. Discussed need to keep follow up appts as recommended and to coordinate all UC medication use with [place]. Bentyl 10 mg qid prn for abd cramping, if it returns. Answered his questions. Follow up in 6 months. 15 minutes of face-to-face time spent with patient. Greater than 50% of time was spent counseling and coordinating care.

## 2016-01-13 ENCOUNTER — Telehealth: Payer: Self-pay | Admitting: Gastroenterology

## 2016-01-13 DIAGNOSIS — R197 Diarrhea, unspecified: Secondary | ICD-10-CM

## 2016-01-13 NOTE — Telephone Encounter (Signed)
Left message for patient to call back  

## 2016-01-14 MED ORDER — PREDNISONE 10 MG PO TABS: ORAL_TABLET | ORAL | Status: DC

## 2016-01-14 NOTE — Telephone Encounter (Signed)
GI pathogen panel Prednisone 40 mg qd x 5d, then 30 mg qd x 5d, continue to reduce by 10 mg every 5d, then 10 mg qod for 10d and DC

## 2016-01-14 NOTE — Telephone Encounter (Signed)
Patient notified of the recommendations He will call back if he fails to improve.

## 2016-01-14 NOTE — Telephone Encounter (Signed)
Patient reports rectal bleeding, urgency, and diarrhea 3-4 times a day for the last 2 weeks.  Patient currently maintained on Humira 40 mg q 2 weeks.  He reports that the dicyclomine does not help with the urgency and diarrhea.  Please advise

## 2016-01-14 NOTE — Telephone Encounter (Signed)
Left message for patient to call back  

## 2016-03-03 ENCOUNTER — Other Ambulatory Visit: Payer: Self-pay

## 2016-03-03 MED ORDER — ADALIMUMAB 40 MG/0.8ML ~~LOC~~ AJKT: 40.0000 mg | AUTO-INJECTOR | SUBCUTANEOUS | 0 refills | Status: DC

## 2016-05-06 IMAGING — CT CT NECK W/ CM
2 of 3 series · 9 of 14 positions shown, 11 images · IV contrast (omnipaque)
Comparison: None.

CLINICAL DATA: 39-year-old male with difficulty swallowing and soft
tissue swelling under the chin in. Initial encounter.

EXAM:
CT NECK WITH CONTRAST
TECHNIQUE: Multidetector CT imaging of the neck was performed using the
standard protocol following the bolus administration of intravenous
contrast.
CONTRAST:  75mL OMNIPAQUE IOHEXOL 300 MG/ML  SOLN

[Series 2: neck 2.0 b31s · axial · 0.41mm/px · z∈[-257,-119]mm · 4 of 117 slices shown]
[im 24/117  bone]
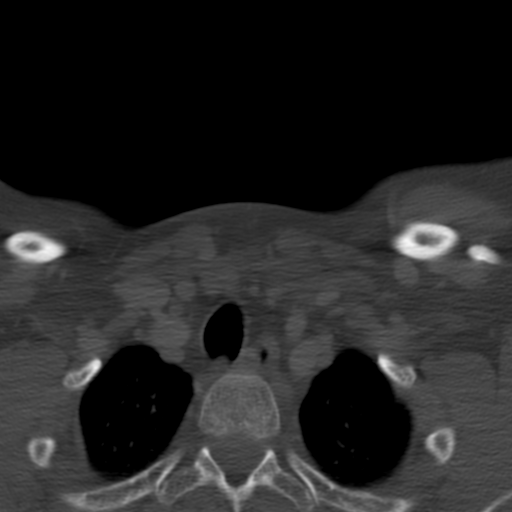
[im 47/117  bone]
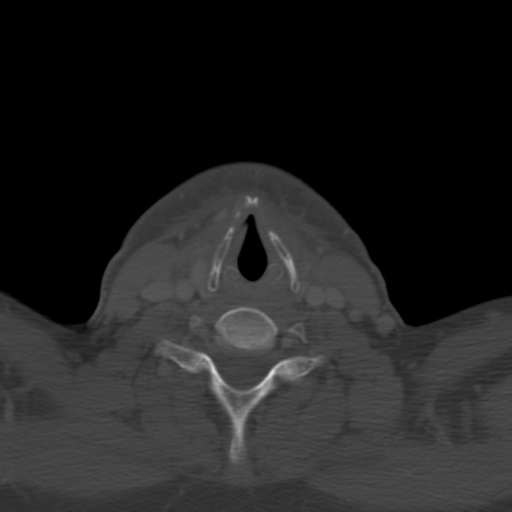
[im 70/117  bone]
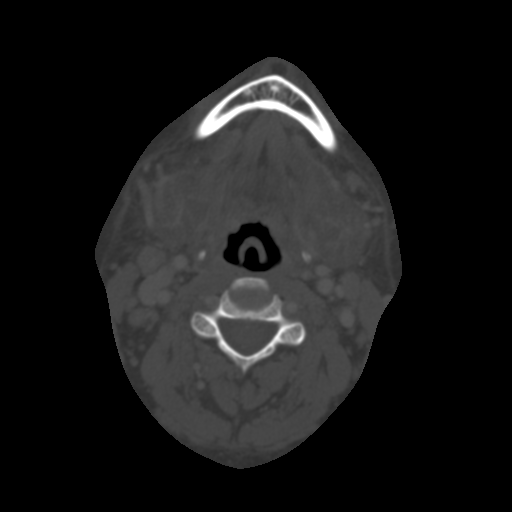
[im 93/117  bone]
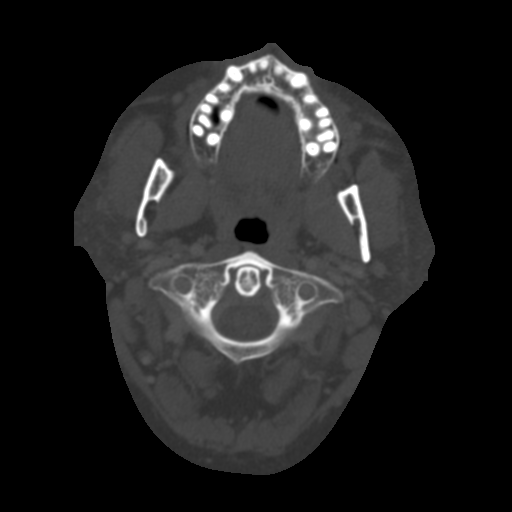

[Series 7: neck 2.0 orth axial to hyoid · axial · 0.34mm/px · z∈[-292,-145]mm · 5 of 117 slices shown, 7 images]
[im 20/117  soft-tissue]
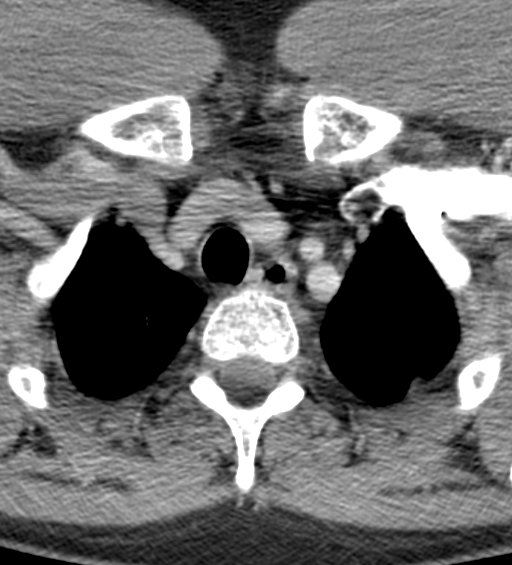
[im 20/117  bone]
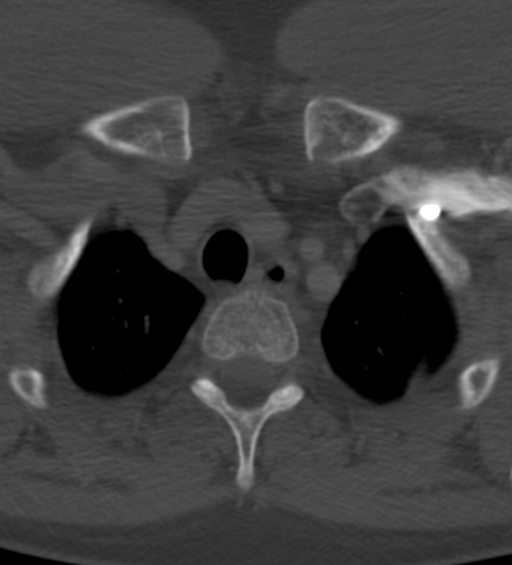
[im 39/117  bone]
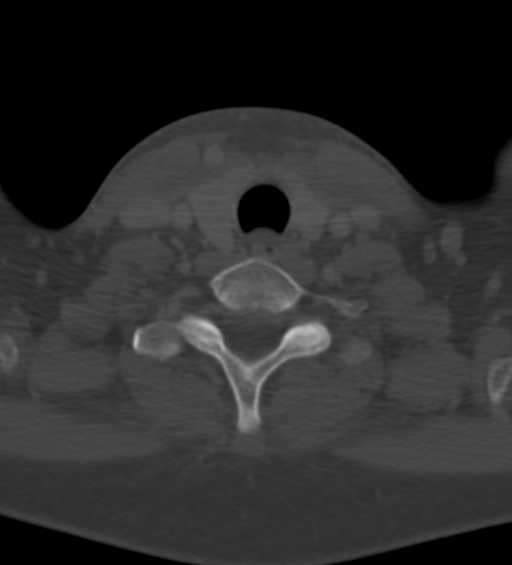
[im 59/117  bone]
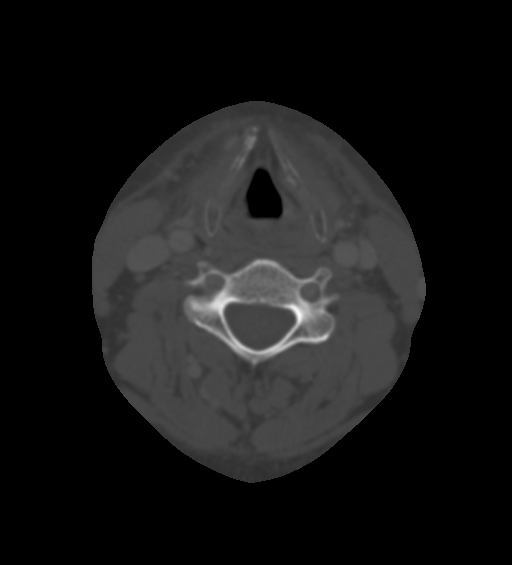
[im 78/117  bone]
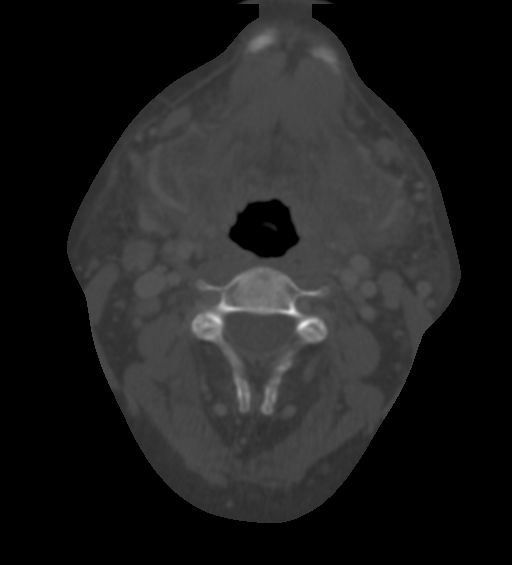
[im 97/117  soft-tissue]
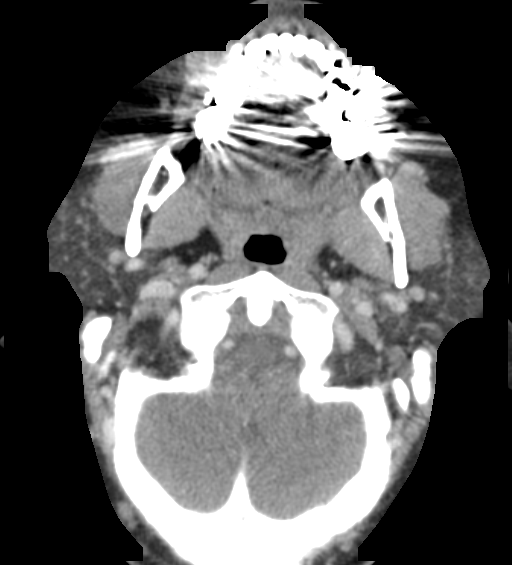
[im 97/117  bone]
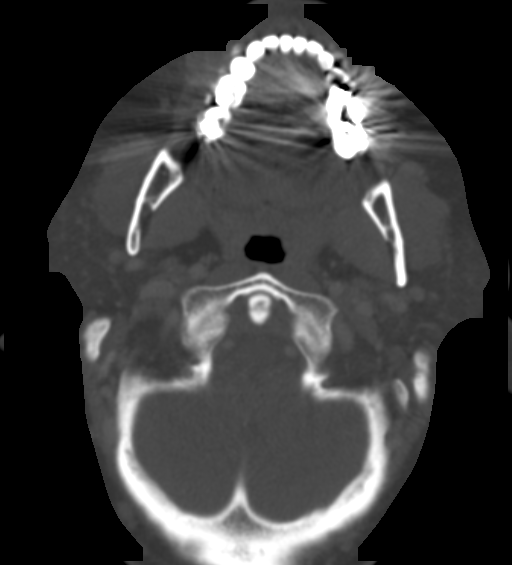

[9 of 14 positions shown; findings below may reference images not displayed]

FINDINGS: Pharynx and larynx: Larynx is within normal limits. Mild motion
artifact in the hypopharynx. Pharyngeal contours are within normal
limits; symmetric appearing palatine tonsil enlargement. Negative
parapharyngeal and retropharyngeal spaces.

Salivary glands: Enlarged and inflamed bilateral submandibular
glands with heterogeneous enhancement submandibular soft tissue
stranding tracking inferiorly and extending across the platysma to
the subcutaneous soft tissues. Sublingual space remains within
normal limits. No level 1 lymphadenopathy. No sublingual or
submandibular sialolithiasis identified.

Parotid glands are within normal limits.

Thyroid: Negative.

Lymph nodes: No level 1 lymphadenopathy. Mild level 2 lymph node
enlargement, reactive appearing. No cystic or necrotic nodes. Other
cervical lymph node stations within normal limits.

Soft tissue inflammation tracking caudally from the submandibular
space to include the subcutaneous fat anterior to the thyroid and
thoracic inlet. No fluid collection.

Vascular: Major vascular structures in the neck and at the skullbase
are patent.

Limited intracranial: Negative.

Visualized orbits: Negative.

Mastoids and visualized paranasal sinuses: Mild left maxillary sinus
mucosal thickening. Other Visualized paranasal sinuses and mastoids
are clear.

Skeleton: No acute osseous abnormality identified. No acute dental
findings identified.

Upper chest: Negative lung apices.  Negative superior mediastinum.
IMPRESSION: 1. Inflamed bilateral submandibular glands with overlying soft
tissue inflammation/ cellulitis tracking inferiorly in the midline.
No sialolithiasis. No fluid collection. Favor infectious
sialoadenitis.
2. Mild, reactive level 2 lymphadenopathy.

## 2016-06-11 ENCOUNTER — Telehealth: Payer: Self-pay

## 2016-06-11 NOTE — Telephone Encounter (Signed)
Received fax from [place] mail order pharmacy for patient's Humira. Patient is due for 6 month f/u with Dr. Fuller Plan for Ulcerative pancolitis. Called patient with no answer and could not leave a voicemail due to mailbox being full. Will attempt call again tomorrow.

## 2016-06-12 MED ORDER — ADALIMUMAB 40 MG/0.8ML ~~LOC~~ AJKT: 40.0000 mg | AUTO-INJECTOR | SUBCUTANEOUS | 0 refills | Status: DC

## 2016-06-12 NOTE — Telephone Encounter (Signed)
Scheduled patient for f/u visit on 06/17/16 at 10:45am. Patient verbalized understanding and prescription for Humira was sent to [facility] rx pharmacy.

## 2016-06-17 ENCOUNTER — Ambulatory Visit (INDEPENDENT_AMBULATORY_CARE_PROVIDER_SITE_OTHER): Payer: 59 | Admitting: Gastroenterology

## 2016-06-17 ENCOUNTER — Other Ambulatory Visit (INDEPENDENT_AMBULATORY_CARE_PROVIDER_SITE_OTHER): Payer: 59

## 2016-06-17 ENCOUNTER — Encounter: Payer: Self-pay | Admitting: Gastroenterology

## 2016-06-17 VITALS — BP 122/86 | HR 84 | Ht 70.0 in | Wt 200.5 lb

## 2016-06-17 DIAGNOSIS — K5100 Ulcerative (chronic) pancolitis without complications: Secondary | ICD-10-CM

## 2016-06-17 LAB — TSH: TSH: 1.3 u[IU]/mL (ref 0.35–4.50)

## 2016-06-17 LAB — COMPREHENSIVE METABOLIC PANEL
ALK PHOS: 54 U/L (ref 39–117)
ALT: 17 U/L (ref 0–53)
AST: 14 U/L (ref 0–37)
Albumin: 4.7 g/dL (ref 3.5–5.2)
BILIRUBIN TOTAL: 0.5 mg/dL (ref 0.2–1.2)
BUN: 16 mg/dL (ref 6–23)
CO2: 30 mEq/L (ref 19–32)
Calcium: 9.8 mg/dL (ref 8.4–10.5)
Chloride: 100 mEq/L (ref 96–112)
Creatinine, Ser: 1 mg/dL (ref 0.40–1.50)
GFR: 87.68 mL/min (ref >60.00)
GLUCOSE: 108 mg/dL — AB (ref 70–99)
Potassium: 4.7 mEq/L (ref 3.5–5.1)
SODIUM: 136 meq/L (ref 135–145)
TOTAL PROTEIN: 8.6 g/dL — AB (ref 6.0–8.3)

## 2016-06-17 LAB — CBC WITH DIFFERENTIAL/PLATELET
BASOS ABS: 0 10*3/uL (ref 0.0–0.1)
Basophils Relative: 0.4 % (ref 0.0–3.0)
EOS ABS: 0 10*3/uL (ref 0.0–0.7)
Eosinophils Relative: 0.2 % (ref 0.0–5.0)
HCT: 45.9 % (ref 39.0–52.0)
Hemoglobin: 15.4 g/dL (ref 13.0–17.0)
LYMPHS ABS: 0.9 10*3/uL (ref 0.7–4.0)
Lymphocytes Relative: 11.9 % — ABNORMAL LOW (ref 12.0–46.0)
MCHC: 33.5 g/dL (ref 30.0–36.0)
MCV: 83.3 fl (ref 78.0–100.0)
MONO ABS: 0.2 10*3/uL (ref 0.1–1.0)
Monocytes Relative: 3.2 % (ref 3.0–12.0)
NEUTROS PCT: 84.3 % — AB (ref 43.0–77.0)
Neutro Abs: 6.1 10*3/uL (ref 1.4–7.7)
Platelets: 275 10*3/uL (ref 150.0–400.0)
RBC: 5.51 Mil/uL (ref 4.22–5.81)
RDW: 15.8 % — ABNORMAL HIGH (ref 11.5–15.5)
WBC: 7.2 10*3/uL (ref 4.0–10.5)

## 2016-06-17 LAB — C-REACTIVE PROTEIN: CRP: 0.6 mg/dL (ref 0.5–20.0)

## 2016-06-17 LAB — SEDIMENTATION RATE: Sed Rate: 26 mm/hr — ABNORMAL HIGH (ref 0–15)

## 2016-06-17 NOTE — Progress Notes (Signed)
    History of Present Illness: This is a 40 year old male returning for follow-up of ulcerative pancolitis maintained on Humira. He has had no problems since his visit in May. He relates normal bowel movements without bleeding diarrhea abdominal pain or mucus.  Current Medications, Allergies, Past Medical History, Past Surgical History, Family History and Social History were reviewed in [employer] record.  Physical Exam: General: Well developed, well nourished, no acute distress Head: Normocephalic and atraumatic Eyes:  sclerae anicteric, EOMI Ears: Normal auditory acuity Mouth: No deformity or lesions Lungs: Clear throughout to auscultation Heart: Regular rate and rhythm; no murmurs, rubs or bruits Abdomen: Soft, non tender and non distended. No masses, hepatosplenomegaly or hernias noted. Normal Bowel sounds Musculoskeletal: Symmetrical with no gross deformities  Pulses:  Normal pulses noted Extremities: No clubbing, cyanosis, edema or deformities noted Neurological: Alert oriented x 4, grossly nonfocal Psychological:  Alert and cooperative. Normal mood and affect  Assessment and Recommendations:

## 2016-06-17 NOTE — Patient Instructions (Signed)
Your physician has requested that you go to the basement for lab work before leaving today.  Thank you for choosing me and [facility].  Pricilla Riffle. Dagoberto Ligas., MD., Marval Regal

## 2016-06-17 NOTE — Assessment & Plan Note (Addendum)
Symptoms under excellent control. Continue Humira every 2 weeks. CBC, CMP, TSH, ESR, CRP today. Discussed the possibility of colonoscopy to assess disease activity. He will consider this within the next year or two but wishes to defer at this time. REV in 6 months.  I spent 15 minutes of face-to-face time with the patient. Greater than 50% of the time was spent counseling and coordinating care.

## 2016-06-18 ENCOUNTER — Other Ambulatory Visit: Payer: Self-pay | Admitting: Gastroenterology

## 2016-06-25 ENCOUNTER — Telehealth: Payer: Self-pay | Admitting: Gastroenterology

## 2016-06-25 ENCOUNTER — Other Ambulatory Visit: Payer: Self-pay | Admitting: Gastroenterology

## 2016-06-25 NOTE — Telephone Encounter (Signed)
If dicyclomine doesn't relieve his symptoms advise him call back

## 2016-06-25 NOTE — Telephone Encounter (Signed)
Pt states the medication needs to be sent to the  [facility] on [address]

## 2016-06-25 NOTE — Telephone Encounter (Signed)
Wife reports that patient is in terrible pain and needs prednisone.  She reports he has had pain for over a month.  After reviewing the office note from 06/17/16 with Dr. Fuller Plan I contacted the patient.  He denies his wife's statement that he is in terrible pain.  He does report 4 days of cramping and urgency.  He denies diarrhea or rectal bleeding.  He is out of dicyclomine.  He feels much like he did in May prior to being treated with a course of prednisone.  I sent in a refill of dicyclomine.Marland Kitchen  He understands that I will call back tomorrow once Dr. Fuller Plan reviews for additional recommendations.

## 2016-06-26 MED ORDER — DICYCLOMINE HCL 20 MG PO TABS: 20.0000 mg | ORAL_TABLET | Freq: Four times a day (QID) | ORAL | 1 refills | Status: DC | PRN

## 2016-06-26 NOTE — Telephone Encounter (Signed)
Wife notified.

## 2016-07-01 ENCOUNTER — Telehealth: Payer: Self-pay | Admitting: Gastroenterology

## 2016-07-01 NOTE — Telephone Encounter (Signed)
Pt states he is still having a lot of abdominal discomfort along his belt line. Pt is requesting canasa or lialda. Please advise.

## 2016-07-03 ENCOUNTER — Other Ambulatory Visit: Payer: Self-pay | Admitting: Nurse Practitioner

## 2016-07-03 NOTE — Telephone Encounter (Signed)
Office closed today. Taking outpatient calls. Patient called with complaints of abdominal pain, loose stool with blood. He is on Humira, was doing well at follow up visit earlier this month. He is requesting Prednisone. Will call Prednisone taper. Patient states he normally starts at 58m.

## 2016-08-31 ENCOUNTER — Other Ambulatory Visit: Payer: Self-pay

## 2016-08-31 MED ORDER — ADALIMUMAB 40 MG/0.8ML ~~LOC~~ AJKT: 40.0000 mg | AUTO-INJECTOR | SUBCUTANEOUS | 0 refills | Status: DC

## 2016-10-09 ENCOUNTER — Telehealth: Payer: Self-pay | Admitting: Gastroenterology

## 2016-10-09 MED ORDER — ADALIMUMAB 40 MG/0.8ML ~~LOC~~ AJKT: 40.0000 mg | AUTO-INJECTOR | SUBCUTANEOUS | 6 refills | Status: DC

## 2016-10-09 NOTE — Telephone Encounter (Signed)
rx sent

## 2016-10-19 ENCOUNTER — Telehealth: Payer: Self-pay | Admitting: Gastroenterology

## 2016-10-19 ENCOUNTER — Other Ambulatory Visit: Payer: Self-pay | Admitting: Nurse Practitioner

## 2016-10-19 MED ORDER — ADALIMUMAB 40 MG/0.8ML ~~LOC~~ AJKT: 40.0000 mg | AUTO-INJECTOR | SUBCUTANEOUS | 6 refills | Status: DC

## 2016-10-19 NOTE — Telephone Encounter (Signed)
Prior authorization was completed last week and approved.  I resent the RX to [facility].  He is notified to give it a few minutes and call them back.

## 2016-10-19 NOTE — Telephone Encounter (Signed)
I called back and spoke with [facility].  Approved and they will contact the patient about shipping the drug.  He is notified

## 2016-10-20 ENCOUNTER — Other Ambulatory Visit: Payer: Self-pay | Admitting: Gastroenterology

## 2016-10-21 ENCOUNTER — Telehealth: Payer: Self-pay | Admitting: Gastroenterology

## 2016-10-21 ENCOUNTER — Other Ambulatory Visit: Payer: Self-pay | Admitting: Gastroenterology

## 2016-10-21 NOTE — Telephone Encounter (Signed)
Sent message thru [email or web address] website that says:"I am a patient of Doctor Starks. Since Monday 10/19/2016 I have saw some blood in my stool. Not much but some, stomach is a little irritated. I took my humira shot 10/20/2016, it was a little late due to some issues with insurance. Anyways not sure if I need a dose of prednisone or not. Please get this message to Doctor Silvio Pate and nurse Judeen Hammans. I do not want to have another flare up. Sometimes it seems I need the prednisone also. Thanks"

## 2016-10-21 NOTE — Telephone Encounter (Signed)
Left message for patient to call back  

## 2016-10-26 NOTE — Telephone Encounter (Signed)
No return call from the patient

## 2016-11-23 ENCOUNTER — Encounter: Payer: Self-pay | Admitting: Family Medicine

## 2016-11-23 ENCOUNTER — Telehealth: Payer: Self-pay | Admitting: Gastroenterology

## 2016-11-23 ENCOUNTER — Ambulatory Visit (INDEPENDENT_AMBULATORY_CARE_PROVIDER_SITE_OTHER): Payer: 59 | Admitting: Family Medicine

## 2016-11-23 VITALS — BP 126/89 | HR 62 | Temp 98.2°F | Resp 16 | Ht 70.0 in | Wt 205.0 lb

## 2016-11-23 DIAGNOSIS — T7840XA Allergy, unspecified, initial encounter: Secondary | ICD-10-CM

## 2016-11-23 DIAGNOSIS — R197 Diarrhea, unspecified: Secondary | ICD-10-CM

## 2016-11-23 DIAGNOSIS — W57XXXA Bitten or stung by nonvenomous insect and other nonvenomous arthropods, initial encounter: Secondary | ICD-10-CM | POA: Diagnosis not present

## 2016-11-23 DIAGNOSIS — R21 Rash and other nonspecific skin eruption: Secondary | ICD-10-CM | POA: Diagnosis not present

## 2016-11-23 DIAGNOSIS — K51011 Ulcerative (chronic) pancolitis with rectal bleeding: Secondary | ICD-10-CM

## 2016-11-23 MED ORDER — PREDNISONE 20 MG PO TABS: ORAL_TABLET | ORAL | 0 refills | Status: DC

## 2016-11-23 MED ORDER — DOXYCYCLINE HYCLATE 100 MG PO TABS: ORAL_TABLET | ORAL | 0 refills | Status: DC

## 2016-11-23 MED ORDER — PREDNISONE 10 MG PO TABS
ORAL_TABLET | ORAL | 0 refills | Status: DC
Start: 2016-11-23 — End: 2017-01-05

## 2016-11-23 NOTE — Telephone Encounter (Signed)
Patient notified of recommendations He will come for labs today or tomorrow Follow up scheduled for 01/05/17

## 2016-11-23 NOTE — Progress Notes (Signed)
OFFICE VISIT  11/23/2016   CC:  Chief Complaint  Patient presents with  . Tick Removal    pt pulled tick off of his right hip/side friday (3 days ago)   HPI:    Patient is a 40 y.o.  male who presents for tick bite. Pulled a tick off R upper hip region 3 d/a.   He has diffuse redness around the site of the bit that is itchy and about 15 cm x 20 cm.  The area of redness has been gradually growing since he took the tick off.  Denies pain at the area. No fatigue, no HA, no arthralgias, no fevers. No myalgias, no neck pain or stiffness.  Past Medical History:  Diagnosis Date  . Elevated blood pressure reading without diagnosis of hypertension   . Hypercholesteremia   . 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  [facility] notes state that since 2001 he had multiple episodes of proctitis, proctoscopy done 2005 but biopsies not performed, then pt referred to GI and diagnostic colonoscopy was recommended but pt failed to schedule this b/c his bleeding resolved (most recent GI visit as per their old notes is 01/18/2012, with Dr. Wynetta Emery)  . Ulcerative colitis ([place])    Indeterminate quantiferon gold TB test, CXR normal.  Cleared for humira 06/2014.    Past Surgical History:  Procedure Laterality Date  . COLONOSCOPY N/A 06/22/2014   Procedure: COLONOSCOPY;  Surgeon: Milus Banister, MD;  Location: [facility];  Service: Endoscopy;  Laterality: N/A;    Outpatient Medications Prior to Visit  Medication Sig Dispense Refill  . Adalimumab (HUMIRA PEN) 40 MG/0.8ML PNKT Inject 40 mg into the skin every 14 (fourteen) days. 2 each 6  . dicyclomine (BENTYL) 20 MG tablet Take 1 tablet (20 mg total) by mouth every 6 (six) hours as needed for spasms. 120 tablet 1  . dicyclomine (BENTYL) 10 MG capsule TAKE 1 CAPSULE(10 MG) BY MOUTH EVERY 6 HOURS AS NEEDED FOR SPASMS (Patient not taking: Reported on 11/23/2016) 90 capsule 5   No facility-administered  medications prior to visit.     Allergies  Allergen Reactions  . Acyclovir And Related     ROS As per HPI  PE: Blood pressure 126/89, pulse 62, temperature 98.2 F (36.8 C), temperature source Oral, resp. rate 16, height 5' 10"  (1.778 m), weight 205 lb (93 kg), SpO2 97 %. Gen: Alert, well appearing.  Patient is oriented to person, place, time, and situation. AFFECT: pleasant, lucid thought and speech. SKIN: R iliac crest region with approx oblong shaped area of mild erythema that is about 20 cm [place] axis by 12 cm short axis.  Borders pretty distinct.  Just a hint of "whelp-like" feeling to palpation.  +Warmth.  No tenderness.  No fluctuance or induration.  No central clearing but appears to be a small central bruise.  No pustules, papules, vesicles, or streaking.  LABS:  none  IMPRESSION AND PLAN:  Tick bite with rash around it: suspect hypersensitivity rxn to the tick bite/saliva.  However, very difficult to r/o erythema migrans rash of early Lyme dz.  Fortunately, he has no signs of illness. Will treat empirically with doxycycline 139m bid x 14d. Will also give prednisone 463mqd x 3d, then 2033md x 3d. D/c benadryl.  Take allegra 180m61m until itching is gone.  An After Visit Summary was printed and given to the patient.  FOLLOW UP: Return if symptoms  worsen or fail to improve.  Signed:  Crissie Sickles, MD           11/23/2016

## 2016-11-23 NOTE — Progress Notes (Signed)
Pre visit review using our clinic review tool, if applicable. No additional management support is needed unless otherwise documented below in the visit note. 

## 2016-11-23 NOTE — Telephone Encounter (Signed)
CMET, CBC, ESR, CRP, GI pathogen panel Continue Humira - current dose and schedule Prednisone 40 mg daily x 5d, 30 mg daily x 5d, 20 mg daily x 5d, 10 mg daily x 5d, 10 mg qod for 10 days.  Schedule follow up visit within 6 weeks.  Call if not improved.

## 2016-11-23 NOTE — Telephone Encounter (Signed)
Patient reports rectal bleeding and diarrhea for the last 2 days.  He reports one large bloody stool each day. Denies urgency or other complaints at this time.  He is maintained on Humira for his UC, his last injection was last week.

## 2016-11-23 NOTE — Patient Instructions (Signed)
Buy over the counter, generic allegra 124m, take 1 tab per day until itching is gone.

## 2017-01-05 ENCOUNTER — Other Ambulatory Visit (INDEPENDENT_AMBULATORY_CARE_PROVIDER_SITE_OTHER): Payer: 59

## 2017-01-05 ENCOUNTER — Ambulatory Visit (INDEPENDENT_AMBULATORY_CARE_PROVIDER_SITE_OTHER): Payer: 59 | Admitting: Gastroenterology

## 2017-01-05 ENCOUNTER — Encounter: Payer: Self-pay | Admitting: Gastroenterology

## 2017-01-05 VITALS — BP 118/84 | HR 88 | Ht 70.0 in | Wt 207.0 lb

## 2017-01-05 DIAGNOSIS — K5100 Ulcerative (chronic) pancolitis without complications: Secondary | ICD-10-CM | POA: Diagnosis not present

## 2017-01-05 DIAGNOSIS — K51011 Ulcerative (chronic) pancolitis with rectal bleeding: Secondary | ICD-10-CM

## 2017-01-05 LAB — COMPREHENSIVE METABOLIC PANEL
ALT: 15 U/L (ref 0–53)
AST: 14 U/L (ref 0–37)
Albumin: 4.4 g/dL (ref 3.5–5.2)
Alkaline Phosphatase: 52 U/L (ref 39–117)
BILIRUBIN TOTAL: 0.4 mg/dL (ref 0.2–1.2)
BUN: 15 mg/dL (ref 6–23)
CO2: 29 mEq/L (ref 19–32)
Calcium: 9.4 mg/dL (ref 8.4–10.5)
Chloride: 100 mEq/L (ref 96–112)
Creatinine, Ser: 0.95 mg/dL (ref 0.40–1.50)
GFR: 92.77 mL/min (ref >60.00)
GLUCOSE: 99 mg/dL (ref 70–99)
POTASSIUM: 4.1 meq/L (ref 3.5–5.1)
SODIUM: 136 meq/L (ref 135–145)
TOTAL PROTEIN: 8.1 g/dL (ref 6.0–8.3)

## 2017-01-05 LAB — CBC WITH DIFFERENTIAL/PLATELET
BASOS ABS: 0.1 10*3/uL (ref 0.0–0.1)
Basophils Relative: 1.2 % (ref 0.0–3.0)
EOS ABS: 0.2 10*3/uL (ref 0.0–0.7)
Eosinophils Relative: 3 % (ref 0.0–5.0)
HCT: 46.8 % (ref 39.0–52.0)
Hemoglobin: 15.8 g/dL (ref 13.0–17.0)
LYMPHS ABS: 1.8 10*3/uL (ref 0.7–4.0)
Lymphocytes Relative: 31.8 % (ref 12.0–46.0)
MCHC: 33.8 g/dL (ref 30.0–36.0)
MCV: 87.3 fl (ref 78.0–100.0)
MONO ABS: 0.7 10*3/uL (ref 0.1–1.0)
Monocytes Relative: 12.6 % — ABNORMAL HIGH (ref 3.0–12.0)
NEUTROS PCT: 51.4 % (ref 43.0–77.0)
Neutro Abs: 2.9 10*3/uL (ref 1.4–7.7)
Platelets: 273 10*3/uL (ref 150.0–400.0)
RBC: 5.37 Mil/uL (ref 4.22–5.81)
RDW: 15.2 % (ref 11.5–15.5)
WBC: 5.7 10*3/uL (ref 4.0–10.5)

## 2017-01-05 LAB — SEDIMENTATION RATE: Sed Rate: 16 mm/hr — ABNORMAL HIGH (ref 0–15)

## 2017-01-05 NOTE — Progress Notes (Signed)
    History of Present Illness: This is a 41 year old male with UC returning with a flare of bloody diarrhea for 2-3 days. He did not complete stool studies and blood work as recommended. He completed 1 week of a recommnded several week prednisone taper. He states that after a few days his bloody diarrhea completely resolved so he discontinued prednisone. He offers a few different explanations of why he didn't complete the blood work and stool studies as recommended primarily because his symptoms resolved. He did not contact us to let us know. Reviewing his colonoscopy it was complete to the splenic flexure due to the severity of colitis. CT scan revealed diffuse colonic wall thickening.  Current Medications, Allergies, Past Medical History, Past Surgical History, Family History and Social History were reviewed in [employer] record.  Physical Exam: General: Well developed, well nourished, no acute distress Head: Normocephalic and atraumatic Eyes:  sclerae anicteric, EOMI Ears: Normal auditory acuity Mouth: No deformity or lesions Lungs: Clear throughout to auscultation Heart: Regular rate and rhythm; no murmurs, rubs or bruits Abdomen: Soft, non tender and non distended. No masses, hepatosplenomegaly or hernias noted. Normal Bowel sounds Musculoskeletal: Symmetrical with no gross deformities  Pulses:  Normal pulses noted Extremities: No clubbing, cyanosis, edema or deformities noted Neurological: Alert oriented x 4, grossly nonfocal Psychological:  Alert and cooperative. Normal mood and affect  Assessment and Recommendations:  1. Ulcerative pancolitis without complication. Continue Humira every 2 weeks. Advised patient to contact us with any flares or with any deviation from recommended medication usage. At his return office visit we will discuss colonoscopy to reassess his disease and to assess his right colon which was not seen on prior colonoscopy. CMP, CBC, ESR, CRP  today. REV in 6 months.

## 2017-01-05 NOTE — Patient Instructions (Signed)
Your physician has requested that you go to the basement for lab work before leaving today.  Please follow up with Dr. Fuller Plan in 6 months.   Thank you for choosing me and [facility].  Pricilla Riffle. Dagoberto Ligas., MD., Marval Regal

## 2017-01-06 LAB — C-REACTIVE PROTEIN: CRP: 0.3 mg/dL — ABNORMAL LOW (ref 0.5–20.0)

## 2017-01-14 ENCOUNTER — Telehealth: Payer: Self-pay | Admitting: Gastroenterology

## 2017-01-14 MED ORDER — ADALIMUMAB 40 MG/0.8ML ~~LOC~~ AJKT: 40.0000 mg | AUTO-INJECTOR | SUBCUTANEOUS | 6 refills | Status: DC

## 2017-01-14 NOTE — Telephone Encounter (Signed)
New rx sent

## 2017-01-15 ENCOUNTER — Telehealth: Payer: Self-pay | Admitting: Gastroenterology

## 2017-01-15 NOTE — Telephone Encounter (Signed)
Snapshot faxed

## 2017-01-19 ENCOUNTER — Telehealth: Payer: Self-pay | Admitting: Gastroenterology

## 2017-01-19 NOTE — Telephone Encounter (Signed)
Patient notified that we have already communicated with [facility] and they have had refills since last week

## 2017-01-20 ENCOUNTER — Telehealth: Payer: Self-pay | Admitting: Gastroenterology

## 2017-01-20 NOTE — Telephone Encounter (Signed)
Keith Jensen has approved Humira with ref # [id number]. Called [facility] home delivery pharmacy to have them mail out the prescription. They state they are having a problem with his co-pay card but are going to re run his card in 30 mins to make sure it goes through and then the will over night the medication to patient. Patient aware.

## 2017-01-20 NOTE — Telephone Encounter (Signed)
Patient states that he is still having problems with insurance and humira.

## 2017-01-20 NOTE — Telephone Encounter (Signed)
noted 

## 2017-01-20 NOTE — Telephone Encounter (Signed)
Patient notified that I spoke with the pharmacy yesterday and there is no problem.  He is asked to contact them to provide payment

## 2017-01-20 NOTE — Telephone Encounter (Signed)
Keith Jensen called back from [employer] Humira has been approved and they will be reaching out to patient to schedule delivery.

## 2017-04-16 ENCOUNTER — Telehealth: Payer: Self-pay | Admitting: Gastroenterology

## 2017-04-16 MED ORDER — PREDNISONE 10 MG PO TABS: 40.0000 mg | ORAL_TABLET | Freq: Every day | ORAL | 0 refills | Status: DC

## 2017-04-16 NOTE — Telephone Encounter (Signed)
Patient reports that he has had several days of abdominal cramping and rectal bleeding on the tissue when he wipes.  He has a history of UC,.  He denies any diarrhea or other GI complaints.  He is maintained on Humira for his UC 40 mg every 14 days.  Dr. Carlean Purl you are MD of the day, please advise

## 2017-04-16 NOTE — Telephone Encounter (Signed)
Prednisone 10 mg tabs #42   4/day x 3 days 3/day x 3 days 2/day x 6 days 1/day x 6 days 1/2/day x 6 days  Ask him to check in Monday with a sx update and update Dr. Fuller Plan then

## 2017-04-16 NOTE — Telephone Encounter (Signed)
Patient notified of recommendations He is asked to call with an update next week

## 2017-04-22 MED ORDER — ADALIMUMAB 40 MG/0.8ML ~~LOC~~ AJKT: 40.0000 mg | AUTO-INJECTOR | SUBCUTANEOUS | 0 refills | Status: DC

## 2017-04-22 NOTE — Telephone Encounter (Signed)
Error

## 2017-06-18 ENCOUNTER — Telehealth: Payer: Self-pay | Admitting: Gastroenterology

## 2017-06-18 MED ORDER — ADALIMUMAB 40 MG/0.8ML ~~LOC~~ AJKT: 40.0000 mg | AUTO-INJECTOR | SUBCUTANEOUS | 6 refills | Status: DC
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
## 2017-06-25 ENCOUNTER — Other Ambulatory Visit: Payer: Self-pay | Admitting: Gastroenterology

## 2017-08-19 ENCOUNTER — Other Ambulatory Visit (INDEPENDENT_AMBULATORY_CARE_PROVIDER_SITE_OTHER): Payer: Managed Care, Other (non HMO)

## 2017-08-19 ENCOUNTER — Encounter: Payer: Self-pay | Admitting: Gastroenterology

## 2017-08-19 ENCOUNTER — Ambulatory Visit (INDEPENDENT_AMBULATORY_CARE_PROVIDER_SITE_OTHER): Payer: Managed Care, Other (non HMO) | Admitting: Gastroenterology

## 2017-08-19 VITALS — BP 128/90 | HR 64 | Ht 70.0 in | Wt 214.8 lb

## 2017-08-19 DIAGNOSIS — K5100 Ulcerative (chronic) pancolitis without complications: Secondary | ICD-10-CM | POA: Diagnosis not present

## 2017-08-19 LAB — COMPREHENSIVE METABOLIC PANEL
ALBUMIN: 4.5 g/dL (ref 3.5–5.2)
ALK PHOS: 63 U/L (ref 39–117)
ALT: 22 U/L (ref 0–53)
AST: 14 U/L (ref 0–37)
BUN: 15 mg/dL (ref 6–23)
CO2: 30 mEq/L (ref 19–32)
Calcium: 9.6 mg/dL (ref 8.4–10.5)
Chloride: 99 mEq/L (ref 96–112)
Creatinine, Ser: 1.05 mg/dL (ref 0.40–1.50)
GFR: 82.4 mL/min (ref >60.00)
GLUCOSE: 99 mg/dL (ref 70–99)
POTASSIUM: 4.5 meq/L (ref 3.5–5.1)
Sodium: 137 mEq/L (ref 135–145)
TOTAL PROTEIN: 8.5 g/dL — AB (ref 6.0–8.3)
Total Bilirubin: 0.5 mg/dL (ref 0.2–1.2)

## 2017-08-19 LAB — CBC WITH DIFFERENTIAL/PLATELET
BASOS PCT: 0.8 % (ref 0.0–3.0)
Basophils Absolute: 0 10*3/uL (ref 0.0–0.1)
EOS PCT: 4.9 % (ref 0.0–5.0)
Eosinophils Absolute: 0.2 10*3/uL (ref 0.0–0.7)
HCT: 48.7 % (ref 39.0–52.0)
Hemoglobin: 16.2 g/dL (ref 13.0–17.0)
LYMPHS ABS: 1.9 10*3/uL (ref 0.7–4.0)
Lymphocytes Relative: 41.2 % (ref 12.0–46.0)
MCHC: 33.3 g/dL (ref 30.0–36.0)
MCV: 90.6 fl (ref 78.0–100.0)
MONOS PCT: 10.9 % (ref 3.0–12.0)
Monocytes Absolute: 0.5 10*3/uL (ref 0.1–1.0)
NEUTROS PCT: 42.2 % — AB (ref 43.0–77.0)
Neutro Abs: 1.9 10*3/uL (ref 1.4–7.7)
Platelets: 243 10*3/uL (ref 150.0–400.0)
RBC: 5.37 Mil/uL (ref 4.22–5.81)
RDW: 13.5 % (ref 11.5–15.5)
WBC: 4.5 10*3/uL (ref 4.0–10.5)

## 2017-08-19 LAB — SEDIMENTATION RATE: SED RATE: 23 mm/h — AB (ref 0–15)

## 2017-08-19 LAB — C-REACTIVE PROTEIN: CRP: 0.7 mg/dL (ref 0.5–20.0)

## 2017-08-19 MED ORDER — NA SULFATE-K SULFATE-MG SULF 17.5-3.13-1.6 GM/177ML PO SOLN: 1.0000 | Freq: Once | ORAL | 0 refills | Status: AC

## 2017-08-19 NOTE — Patient Instructions (Addendum)
Your physician has requested that you go to the basement for lab work before leaving today.  You have been scheduled for a colonoscopy. Please follow written instructions given to you at your visit today.  Please pick up your prep supplies at the pharmacy within the next 1-3 days. If you use inhalers (even only as needed), please bring them with you on the day of your procedure. Your physician has requested that you go to www.startemmi.com and enter the access code given to you at your visit today. This web site gives a general overview about your procedure. However, you should still follow specific instructions given to you by our office regarding your preparation for the procedure.  Thank you for choosing me and [facility].  Pricilla Riffle. Dagoberto Ligas., MD., Marval Regal

## 2017-08-19 NOTE — Progress Notes (Signed)
    History of Present Illness: This is a 42 year old male returning for follow-up of ulcerative pancolitis.  He is maintained on Humira.  Previous colonoscopy was only complete to the splenic flexure due to the severity of his colitis.  Imaging studies indicated pancolonic involvement.  He relates no recent problems or flareups.  He asked about Keith Jensen.  Current Medications, Allergies, Past Medical History, Past Surgical History, Family History and Social History were reviewed in [employer] record.  Physical Exam: General: Well developed, well nourished, no acute distress Head: Normocephalic and atraumatic Eyes:  sclerae anicteric, EOMI Ears: Normal auditory acuity Mouth: No deformity or lesions Lungs: Clear throughout to auscultation Heart: Regular rate and rhythm; no murmurs, rubs or bruits Abdomen: Soft, non tender and non distended. No masses, hepatosplenomegaly or hernias noted. Normal Bowel sounds Rectal: Deferred to colonoscopy Musculoskeletal: Symmetrical with no gross deformities  Pulses:  Normal pulses noted Extremities: No clubbing, cyanosis, edema or deformities noted Neurological: Alert oriented x 4, grossly nonfocal Psychological:  Alert and cooperative. Normal mood and affect  Assessment and Recommendations:  1. Ulcerative pancolitis, clinically stable. CMP, CBC, CRP, ESR, HBsAg, QuantiFERON gold plus today.  Continue Humira 40 mg sq q 14 days. Given prior colonoscopy complete only to the splenic flexure have recommended proceeding with colonoscopy. The risks (including bleeding, perforation, infection, missed lesions, medication reactions and possible hospitalization or surgery if complications occur), benefits, and alternatives to colonoscopy with possible biopsy and possible polypectomy were discussed with the patient and they consent to proceed.

## 2017-08-20 LAB — HEPATITIS B SURFACE ANTIGEN: Hepatitis B Surface Ag: NONREACTIVE

## 2017-08-23 ENCOUNTER — Encounter: Payer: Self-pay | Admitting: Family Medicine

## 2017-09-02 ENCOUNTER — Telehealth: Payer: Self-pay

## 2017-09-02 NOTE — Telephone Encounter (Signed)
Error

## 2017-09-17 ENCOUNTER — Encounter: Payer: Self-pay | Admitting: Gastroenterology

## 2017-09-28 ENCOUNTER — Encounter: Payer: Self-pay | Admitting: Gastroenterology

## 2017-10-01 ENCOUNTER — Encounter: Payer: Managed Care, Other (non HMO) | Admitting: Gastroenterology

## 2018-01-17 ENCOUNTER — Other Ambulatory Visit: Payer: Self-pay | Admitting: Gastroenterology

## 2018-01-18 ENCOUNTER — Encounter: Payer: Self-pay | Admitting: Gastroenterology

## 2018-01-18 MED ORDER — ADALIMUMAB 40 MG/0.8ML ~~LOC~~ AJKT: 40.0000 mg | AUTO-INJECTOR | SUBCUTANEOUS | 6 refills | Status: DC

## 2018-01-19 ENCOUNTER — Telehealth: Payer: Self-pay

## 2018-01-19 NOTE — Telephone Encounter (Signed)
Called patient to reschedule Colonoscopy since he is over due for Colonoscopy. Patient states he will be back in his office in a hour and will call me back. Also informed patient I was filling out PA form for [employer] for Humira refills.

## 2018-01-20 ENCOUNTER — Encounter: Payer: Self-pay | Admitting: Gastroenterology

## 2018-01-20 NOTE — Telephone Encounter (Signed)
Patient rescheduled Colonoscopy and nurse visit for August. Faxed PA to [facility] for Humira.

## 2018-01-25 ENCOUNTER — Encounter: Payer: Self-pay | Admitting: Gastroenterology

## 2018-03-10 HISTORY — PX: COLONOSCOPY: SHX174

## 2018-03-17 ENCOUNTER — Ambulatory Visit (AMBULATORY_SURGERY_CENTER): Payer: Self-pay

## 2018-03-17 VITALS — Ht 70.0 in | Wt 214.2 lb

## 2018-03-17 DIAGNOSIS — K51019 Ulcerative (chronic) pancolitis with unspecified complications: Secondary | ICD-10-CM

## 2018-03-17 MED ORDER — NA SULFATE-K SULFATE-MG SULF 17.5-3.13-1.6 GM/177ML PO SOLN: 1.0000 | Freq: Once | ORAL | 0 refills | Status: AC

## 2018-03-17 NOTE — Progress Notes (Signed)
No egg or soy allergy known to patient  No issues with past sedation with any surgeries  or procedures, no intubation problems  No diet pills per patient No home 02 use per patient  No blood thinners per patient  Pt denies issues with constipation  No A fib or A flutter  EMMI video sent to pt's e mail

## 2018-03-21 ENCOUNTER — Encounter: Payer: Self-pay | Admitting: Gastroenterology

## 2018-03-31 ENCOUNTER — Encounter: Payer: Self-pay | Admitting: Gastroenterology

## 2018-03-31 ENCOUNTER — Ambulatory Visit (AMBULATORY_SURGERY_CENTER): Payer: Managed Care, Other (non HMO) | Admitting: Gastroenterology

## 2018-03-31 VITALS — BP 132/86 | HR 71 | Temp 99.1°F | Resp 14 | Ht 70.0 in | Wt 214.0 lb

## 2018-03-31 DIAGNOSIS — K51019 Ulcerative (chronic) pancolitis with unspecified complications: Secondary | ICD-10-CM | POA: Diagnosis present

## 2018-03-31 DIAGNOSIS — K529 Noninfective gastroenteritis and colitis, unspecified: Secondary | ICD-10-CM

## 2018-03-31 MED ORDER — SODIUM CHLORIDE 0.9 % IV SOLN: 500.0000 mL | Freq: Once | INTRAVENOUS | Status: DC

## 2018-03-31 NOTE — Patient Instructions (Signed)

## 2018-03-31 NOTE — Progress Notes (Signed)
Called to room to assist during endoscopic procedure.  Patient ID and intended procedure confirmed with present staff. Received instructions for my participation in the procedure from the performing physician.  

## 2018-03-31 NOTE — Progress Notes (Signed)
Report to PACU, RN, vss, BBS= Clear.  

## 2018-03-31 NOTE — Progress Notes (Signed)
Pt's states no medical or surgical changes since previsit or office visit. 

## 2018-03-31 NOTE — Op Note (Signed)
[facility] Patient Name: Keith Jensen Procedure Date: 03/31/2018 3:41 PM MRN: [id number] Endoscopist: Ladene Artist , MD Age: 42 Referring MD:  Date of Birth: Oct 15, 1975 Gender: Male Account #: [id number] Procedure:                Colonoscopy Indications:              Disease activity assessment of chronic ulcerative                            pancolitis Medicines:                Monitored Anesthesia Care Procedure:                Pre-Anesthesia Assessment:                           - Prior to the procedure, a History and Physical                            was performed, and patient medications and                            allergies were reviewed. The patient's tolerance of                            previous anesthesia was also reviewed. The risks                            and benefits of the procedure and the sedation                            options and risks were discussed with the patient.                            All questions were answered, and informed consent                            was obtained. Prior Anticoagulants: The patient has                            taken no previous anticoagulant or antiplatelet                            agents. ASA Grade Assessment: II - A patient with                            mild systemic disease. After reviewing the risks                            and benefits, the patient was deemed in                            satisfactory condition to undergo the procedure.  After obtaining informed consent, the colonoscope                            was passed under direct vision. Throughout the                            procedure, the patient's blood pressure, pulse, and                            oxygen saturations were monitored continuously. The                            Colonoscope was introduced through the anus and                            advanced to the the cecum, identified by                appendiceal orifice and ileocecal valve. The                            ileocecal valve, appendiceal orifice, and rectum                            were photographed. The quality of the bowel                            preparation was good. The colonoscopy was performed                            without difficulty. The patient tolerated the                            procedure well. Scope In: 3:49:28 PM Scope Out: 4:04:24 PM Scope Withdrawal Time: 0 hours 11 minutes 41 seconds  Total Procedure Duration: 0 hours 14 minutes 56 seconds  Findings:                 The perianal and digital rectal examinations were                            normal.                           Inflammation characterized by loss of vascularity,                            pseudopolyps and scarring was found in a continuous                            and circumferential pattern from the descending                            colon to the transverse colon. This was mild in  severity, and when compared to previous                            examinations, the findings are improved. Biopsies                            were taken with a cold forceps for histology.                           Internal hemorrhoids were found during                            retroflexion. The hemorrhoids were small and Grade                            I (internal hemorrhoids that do not prolapse).                           The exam was otherwise without abnormality on                            direct and retroflexion views. Complications:            No immediate complications. Estimated blood loss:                            None. Estimated Blood Loss:     Estimated blood loss: none. Impression:               - Pancolitis ulcerative colitis. Inflammation was                            found from the descending colon to the transverse                            colon. This was mild in severity, improved  compared                            to previous examinations. Biopsied.                           - Internal hemorrhoids.                           - The examination was otherwise normal on direct                            and retroflexion views. Recommendation:           - Repeat colonoscopy in 2 years for surveillance                            pending pathology review.                           - Patient has a contact number available for  emergencies. The signs and symptoms of potential                            delayed complications were discussed with the                            patient. Return to normal activities tomorrow.                            Written discharge instructions were provided to the                            patient.                           - Resume previous diet.                           - Continue present medications.                           - Await pathology results. Ladene Artist, MD 03/31/2018 4:09:58 PM This report has been signed electronically.

## 2018-04-01 ENCOUNTER — Telehealth: Payer: Self-pay | Admitting: *Deleted

## 2018-04-01 NOTE — Telephone Encounter (Signed)
  Follow up Call-  Call back number 03/31/2018  Post procedure Call Back phone  # [phone]  Permission to leave phone message Yes  Some recent data might be hidden     Patient questions:  Do you have a fever, pain , or abdominal swelling? No. Pain Score  0 *  Have you tolerated food without any problems? Yes.    Have you been able to return to your normal activities? Yes.    Do you have any questions about your discharge instructions: Diet   No. Medications  No. Follow up visit  No.  Do you have questions or concerns about your Care? No.  Actions: * If pain score is 4 or above: No action needed, pain <4.

## 2018-04-05 ENCOUNTER — Encounter: Payer: Self-pay | Admitting: Family Medicine

## 2018-04-19 ENCOUNTER — Encounter: Payer: Self-pay | Admitting: Family Medicine

## 2018-04-19 ENCOUNTER — Encounter: Payer: Self-pay | Admitting: Gastroenterology

## 2018-07-11 ENCOUNTER — Telehealth: Payer: Self-pay | Admitting: Gastroenterology

## 2018-07-11 MED ORDER — ADALIMUMAB 40 MG/0.8ML ~~LOC~~ AJKT: 40.0000 mg | AUTO-INJECTOR | SUBCUTANEOUS | 6 refills | Status: DC

## 2018-07-12 ENCOUNTER — Telehealth: Payer: Self-pay | Admitting: Gastroenterology

## 2018-07-12 MED ORDER — ADALIMUMAB 40 MG/0.8ML ~~LOC~~ AJKT: 40.0000 mg | AUTO-INJECTOR | SUBCUTANEOUS | 6 refills | Status: DC

## 2018-07-12 NOTE — Telephone Encounter (Signed)
rx sent

## 2018-07-12 NOTE — Telephone Encounter (Signed)
Patient states medication humira needs to be called into [facility] since they are apart of [place].

## 2018-07-12 NOTE — Telephone Encounter (Signed)
I spoke with the patient and he will research where the rx really needs to go.  See MyChart from yesterday.  He will let us know

## 2018-08-21 ENCOUNTER — Other Ambulatory Visit: Payer: Self-pay | Admitting: Physician Assistant

## 2018-08-21 ENCOUNTER — Telehealth: Payer: Self-pay | Admitting: Physician Assistant

## 2018-08-21 MED ORDER — PREDNISONE 10 MG PO TABS: 10.0000 mg | ORAL_TABLET | Freq: Every day | ORAL | 0 refills | Status: DC

## 2018-08-21 NOTE — Telephone Encounter (Signed)
Patient has a history of ulcerative colitis.  Recently he has developed increased diarrhea with visible blood.  He had an appointment to see the PA in the office tomorrow but because he is working out of town is not going to be able to make that appointment.  The next day he will be back in town is on Thursday, 08/25/2018.  He wonders if he can get a prescription for prednisone in the meantime.  She has been compliant with taking his Humira every 2 weeks.  Discussed the case with Dr. Carlean Purl.   Provided and called in prescription for prednisone to start at 40 mg, tapering over the next few weeks 20 mg.  6-day, 10 mg tablets with no renewal chronically sent to his pharmacy.  Patient is aware that he needs to call the office tomorrow so that he can be physically seen by MD or app as soon as possible.  Keith Jensen.

## 2018-08-22 ENCOUNTER — Ambulatory Visit: Payer: Managed Care, Other (non HMO) | Admitting: Physician Assistant

## 2018-10-04 DIAGNOSIS — J101 Influenza due to other identified influenza virus with other respiratory manifestations: Secondary | ICD-10-CM

## 2018-10-04 HISTORY — DX: Influenza due to other identified influenza virus with other respiratory manifestations: J10.1

## 2018-10-06 ENCOUNTER — Encounter: Payer: Self-pay | Admitting: Family Medicine

## 2018-12-14 ENCOUNTER — Other Ambulatory Visit: Payer: Self-pay

## 2018-12-14 MED ORDER — ADALIMUMAB 40 MG/0.8ML ~~LOC~~ AJKT: 40.0000 mg | AUTO-INJECTOR | SUBCUTANEOUS | 0 refills | Status: DC

## 2018-12-14 NOTE — Telephone Encounter (Signed)
REVs at least every 6 months is necessary to monitor UC, Humira. Last contact with me was in 03/2018 at colonoscopy so it has been 7 months.  A virtual visit is ok during May 2020 however virtual visit options may not continue to be covered by insurance after PNSQZ-83 pandemic policies expire.

## 2018-12-14 NOTE — Progress Notes (Signed)
hmira

## 2018-12-21 ENCOUNTER — Encounter: Payer: Self-pay | Admitting: Gastroenterology

## 2018-12-21 ENCOUNTER — Ambulatory Visit (INDEPENDENT_AMBULATORY_CARE_PROVIDER_SITE_OTHER): Payer: Managed Care, Other (non HMO) | Admitting: Gastroenterology

## 2018-12-21 ENCOUNTER — Other Ambulatory Visit: Payer: Self-pay

## 2018-12-21 VITALS — Ht 70.0 in | Wt 214.0 lb

## 2018-12-21 DIAGNOSIS — K5100 Ulcerative (chronic) pancolitis without complications: Secondary | ICD-10-CM

## 2018-12-21 NOTE — Patient Instructions (Signed)
Continue Humira.   Thank you for choosing me and [facility].  Pricilla Riffle. Dagoberto Ligas., MD., Marval Regal

## 2018-12-21 NOTE — Progress Notes (Signed)
    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 Blood work in January was remarkable for mildly elevated total protein=8.5 and an ESR=23.  He reports he has been doing very well.  He has 1 formed bowel movement per day without mucus or bleeding.  He notes no abdominal pain.  Current Medications, Allergies, Past Medical History, Past Surgical History, Family History and Social History were reviewed in [employer] record.   Physical Exam: Telemedicine - not performed   Assessment and Recommendations:  Ulcerative colitis, universal, currently very well controlled. Continue Humira. REV in 6 months.     These services were provided via telemedicine, attempted audio and video experienced technical problems so changed to audio only.  The patient was at home and the provider was in the office, alone.  We discussed the limitations of evaluation and management by telemedicine and the availability of in person appointments.  Patient consented for this telemedicine visit and is aware of possible charges for this service.  The other person participating in the telemedicine service was Marlon Pel, [place] who reviewed medications, allergies, past history and completed AVS.  Time spent on call: 4 minutes

## 2018-12-27 ENCOUNTER — Other Ambulatory Visit: Payer: Self-pay

## 2018-12-27 ENCOUNTER — Other Ambulatory Visit: Payer: Self-pay | Admitting: Physician Assistant

## 2018-12-27 MED ORDER — ADALIMUMAB 40 MG/0.8ML ~~LOC~~ AJKT: 40.0000 mg | AUTO-INJECTOR | SUBCUTANEOUS | 6 refills | Status: DC

## 2018-12-28 ENCOUNTER — Other Ambulatory Visit: Payer: Self-pay

## 2018-12-28 ENCOUNTER — Other Ambulatory Visit: Payer: Self-pay | Admitting: Physician Assistant

## 2018-12-28 MED ORDER — PREDNISONE 10 MG PO TABS: ORAL_TABLET | ORAL | 0 refills | Status: DC

## 2019-01-06 ENCOUNTER — Telehealth: Payer: Self-pay | Admitting: Internal Medicine

## 2019-01-06 NOTE — Telephone Encounter (Signed)
Patient of Dr. Fuller Plan with inflammatory bowel disease.  Recently seen via telehealth medicine.  On Humira.  Apparently was on a prednisone taper.  He left his prednisone prescription in his office and needed a limited refill over this weekend to continue his treatment plan.  I prescribed prednisone 10 mg; #10; no refills.

## 2019-05-05 MED ORDER — HUMIRA PEN 40 MG/0.8ML ~~LOC~~ PNKT: 40.0000 mg | PEN_INJECTOR | SUBCUTANEOUS | 6 refills | Status: DC

## 2019-06-01 ENCOUNTER — Other Ambulatory Visit: Payer: Self-pay | Admitting: Internal Medicine

## 2019-06-01 ENCOUNTER — Other Ambulatory Visit: Payer: Self-pay

## 2019-06-01 MED ORDER — PREDNISONE 10 MG PO TABS: ORAL_TABLET | ORAL | 0 refills | Status: DC

## 2019-06-02 ENCOUNTER — Other Ambulatory Visit: Payer: Self-pay | Admitting: Gastroenterology

## 2019-06-05 NOTE — Telephone Encounter (Signed)
Fuller Plan patient

## 2019-11-01 ENCOUNTER — Other Ambulatory Visit: Payer: Self-pay

## 2019-11-01 MED ORDER — HUMIRA PEN 40 MG/0.8ML ~~LOC~~ PNKT: 40.0000 mg | PEN_INJECTOR | SUBCUTANEOUS | 3 refills | Status: DC

## 2020-04-24 ENCOUNTER — Other Ambulatory Visit: Payer: Self-pay

## 2020-04-24 ENCOUNTER — Other Ambulatory Visit: Payer: Self-pay | Admitting: Gastroenterology

## 2020-04-25 ENCOUNTER — Other Ambulatory Visit: Payer: Self-pay

## 2020-04-25 ENCOUNTER — Telehealth: Payer: Managed Care, Other (non HMO) | Admitting: Physician Assistant

## 2020-04-25 ENCOUNTER — Encounter: Payer: Self-pay | Admitting: Family Medicine

## 2020-04-25 ENCOUNTER — Telehealth: Payer: Self-pay | Admitting: Gastroenterology

## 2020-04-25 DIAGNOSIS — R197 Diarrhea, unspecified: Secondary | ICD-10-CM

## 2020-04-25 DIAGNOSIS — R1030 Lower abdominal pain, unspecified: Secondary | ICD-10-CM

## 2020-04-25 MED ORDER — PREDNISONE 10 MG PO TABS: ORAL_TABLET | ORAL | 0 refills | Status: DC

## 2020-04-25 NOTE — Telephone Encounter (Signed)
Please see message below. Original prescriber is Dr.Stark, GI. He is requesting prednisone until he can be seen at their office. Please advise, thanks.

## 2020-04-25 NOTE — Telephone Encounter (Signed)
His last office appt was in 12/2018 with a 6 month follow up appt recommended, way overdue for follow up. Not complying with follow up recommendations. Prednisone 40 mg po qd x 5d, 30 mg po qd x 5d, 20 mg po qd x 5d, 10 mg po qd 5d

## 2020-04-25 NOTE — Telephone Encounter (Signed)
Patient reports that he is having pain along his belt line.  He reports pain is worse when he is up and active.  He denies fever, diarrhea, blood in stool, urgency, or even frequent stools.  Pain has been present since Sunday. Patient has been scheduled for an office visit for 04/29/20.  He is requesting prednisone for the pain. Continues on his Humira every 14 days.   Please advise

## 2020-04-25 NOTE — Telephone Encounter (Signed)
Patient notified rx sent He is asked to make sure he keeps his appt on 04/29/20 with Dr. Fuller Plan

## 2020-04-25 NOTE — Progress Notes (Signed)
Based on what you shared with me, I feel your condition warrants further evaluation and I recommend that you be seen for a face to face office visit.  Given Dr. Anitra Lauth and Dr. Fuller Plan are familiar with your history of UC, I would recommend seeking evaluation in the office today if possible. Unfortunately with your history of UC, I cannot treat your symptoms on a virtual platform, but Dr. Anitra Lauth or Dr. Fuller Plan may feel comfortable doing so. I would recommend reaching out to their offices today, and they may send over a prescription to your pharmacy if they feel it is appropriate without an office visit.     NOTE: If you entered your credit card information for this eVisit, you will not be charged. You may see a "hold" on your card for the $35 but that hold will drop off and you will not have a charge processed.   If you are having a true medical emergency please call 911.      For an urgent face to face visit, [place] [facility] has five urgent care centers for your convenience:      NEW:  [facility] [place] [facility] [place] Get Driving Directions [phone] [address] [place], [place] [address] . 10 am - 6pm Monday - Friday    [facility]) Get Driving Directions [phone] [address] [place], [place] [address] . 10 am to 8 pm Monday-Friday . 12 pm to 8 pm [facility] at [facility] Get Driving Directions [phone] [address], [place], [place] [address] . 8 am to 8 pm Monday-Friday . 9 am to 6 pm Saturday . 11 am to 6 pm Sunday     [facility] at [facility] Get Driving Directions  [phone] [address] [place], [place] [address] . 8 am to 8 pm Monday-Friday . 8 am to 4 pm [facility] at [place] Get Driving Directions [phone] [address] [place],  [address] . 12 pm to  6 pm Monday-Friday      Your e-visit answers were reviewed by a board certified advanced clinical practitioner to complete your personal care plan.  Thank you for using e-Visits.   Greater than 5 minutes, yet less than 10 minutes of time have been spent researching, coordinating, and implementing care for this patient today.

## 2020-04-29 ENCOUNTER — Other Ambulatory Visit (INDEPENDENT_AMBULATORY_CARE_PROVIDER_SITE_OTHER): Payer: Managed Care, Other (non HMO)

## 2020-04-29 ENCOUNTER — Ambulatory Visit (INDEPENDENT_AMBULATORY_CARE_PROVIDER_SITE_OTHER): Payer: Managed Care, Other (non HMO) | Admitting: Gastroenterology

## 2020-04-29 ENCOUNTER — Encounter: Payer: Self-pay | Admitting: Gastroenterology

## 2020-04-29 VITALS — BP 149/72 | HR 99 | Ht 70.0 in | Wt 211.4 lb

## 2020-04-29 DIAGNOSIS — K5100 Ulcerative (chronic) pancolitis without complications: Secondary | ICD-10-CM

## 2020-04-29 LAB — COMPREHENSIVE METABOLIC PANEL
ALT: 19 U/L (ref 0–53)
AST: 13 U/L (ref 0–37)
Albumin: 4.6 g/dL (ref 3.5–5.2)
Alkaline Phosphatase: 55 U/L (ref 39–117)
BUN: 13 mg/dL (ref 6–23)
CO2: 27 mEq/L (ref 19–32)
Calcium: 9.4 mg/dL (ref 8.4–10.5)
Chloride: 100 mEq/L (ref 96–112)
Creatinine, Ser: 1.02 mg/dL (ref 0.40–1.50)
GFR: 79.15 mL/min (ref >60.00)
Glucose, Bld: 123 mg/dL — ABNORMAL HIGH (ref 70–99)
Potassium: 4.4 mEq/L (ref 3.5–5.1)
Sodium: 136 mEq/L (ref 135–145)
Total Bilirubin: 0.5 mg/dL (ref 0.2–1.2)
Total Protein: 8.4 g/dL — ABNORMAL HIGH (ref 6.0–8.3)

## 2020-04-29 LAB — CBC WITH DIFFERENTIAL/PLATELET
Basophils Absolute: 0 10*3/uL (ref 0.0–0.1)
Basophils Relative: 0.2 % (ref 0.0–3.0)
Eosinophils Absolute: 0 10*3/uL (ref 0.0–0.7)
Eosinophils Relative: 0 % (ref 0.0–5.0)
HCT: 47 % (ref 39.0–52.0)
Hemoglobin: 15.9 g/dL (ref 13.0–17.0)
Lymphocytes Relative: 11.3 % — ABNORMAL LOW (ref 12.0–46.0)
Lymphs Abs: 0.8 10*3/uL (ref 0.7–4.0)
MCHC: 33.8 g/dL (ref 30.0–36.0)
MCV: 93.2 fl (ref 78.0–100.0)
Monocytes Absolute: 0.3 10*3/uL (ref 0.1–1.0)
Monocytes Relative: 3.6 % (ref 3.0–12.0)
Neutro Abs: 5.9 10*3/uL (ref 1.4–7.7)
Neutrophils Relative %: 84.9 % — ABNORMAL HIGH (ref 43.0–77.0)
Platelets: 220 10*3/uL (ref 150.0–400.0)
RBC: 5.04 Mil/uL (ref 4.22–5.81)
RDW: 13.5 % (ref 11.5–15.5)
WBC: 7 10*3/uL (ref 4.0–10.5)

## 2020-04-29 LAB — C-REACTIVE PROTEIN: CRP: <1 mg/dL (ref 0.5–20.0)

## 2020-04-29 LAB — TSH: TSH: 1.16 u[IU]/mL (ref 0.35–4.50)

## 2020-04-29 LAB — SEDIMENTATION RATE: Sed Rate: 43 mm/hr — ABNORMAL HIGH (ref 0–15)

## 2020-04-29 NOTE — Progress Notes (Signed)
    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 He has been noncompliant with recommended follow up.  His last office visit was in May 2020.  He developed an bandlike lower abdominal pain that was typical of prior UC flares.  Prednisone taper was started.  He is noted substantial improvement in the lower abdominal pain.  He has not noted increased stool frequency, rectal bleeding or any other symptoms.  Current Medications, Allergies, Past Medical History, Past Surgical History, Family History and Social History were reviewed in [employer] record.   Physical Exam: General: Well developed, well nourished, no acute distress Head: Normocephalic and atraumatic Eyes:  sclerae anicteric, EOMI Ears: Normal auditory acuity Mouth: Not examined, mask on during Covid-19 pandemic Lungs: Clear throughout to auscultation Heart: Regular rate and rhythm; no murmurs, rubs or bruits Abdomen: Soft, non tender and non distended. No masses, hepatosplenomegaly or hernias noted. Normal Bowel sounds Rectal: Not done Musculoskeletal: Symmetrical with no gross deformities  Pulses:  Normal pulses noted Extremities: No clubbing, cyanosis, edema or deformities noted Neurological: Alert oriented x 4, grossly nonfocal Psychological:  Alert and cooperative. Normal mood and affect   Assessment and Recommendations:  1. Ulcerative pancolitis.  Recent mild flare with lower abdominal pain.  Complete prednisone taper as prescribed.  Continue Humira every 2 weeks as prescribed.  Importance of regular follow-up emphasized.  We will defer surveillance colonoscopy at this time given recent flare.  CBC, CMP, TSH, ESR, CRP, HBsAg, quantiferon gold. REV in 6 months.

## 2020-04-29 NOTE — Patient Instructions (Signed)
Your provider has requested that you go to the basement level for lab work before leaving today. Press "B" on the elevator. The lab is located at the first door on the left as you exit the elevator.  Due to recent changes in healthcare laws, you may see the results of your imaging and laboratory studies on MyChart before your provider has had a chance to review them.  We understand that in some cases there may be results that are confusing or concerning to you. Not all laboratory results come back in the same time frame and the provider may be waiting for multiple results in order to interpret others.  Please give [place] 48 hours in order for your provider to thoroughly review all the results before contacting the office for clarification of your results.   Thank you for choosing me and [facility].  Pricilla Riffle. Dagoberto Ligas., MD., Marval Regal

## 2020-05-01 LAB — QUANTIFERON-TB GOLD PLUS
Mitogen-NIL: 8.67 IU/mL
NIL: 0.01 IU/mL
QuantiFERON-TB Gold Plus: NEGATIVE
TB1-NIL: 0 IU/mL
TB2-NIL: 0 IU/mL

## 2020-05-01 LAB — HEPATITIS B SURFACE ANTIGEN: Hepatitis B Surface Ag: NONREACTIVE

## 2020-05-03 ENCOUNTER — Other Ambulatory Visit: Payer: Self-pay

## 2020-05-03 ENCOUNTER — Telehealth: Payer: Self-pay | Admitting: Gastroenterology

## 2020-05-03 MED ORDER — PREDNISONE 10 MG PO TABS: ORAL_TABLET | ORAL | 0 refills | Status: DC

## 2020-05-06 NOTE — Telephone Encounter (Signed)
Answered on MyChart message from 9/24.

## 2020-06-13 ENCOUNTER — Other Ambulatory Visit: Payer: Self-pay

## 2020-06-13 MED ORDER — HUMIRA PEN 40 MG/0.8ML ~~LOC~~ PNKT: 40.0000 mg | PEN_INJECTOR | SUBCUTANEOUS | 4 refills | Status: DC

## 2020-07-08 ENCOUNTER — Telehealth: Payer: Self-pay | Admitting: Gastroenterology

## 2020-07-08 NOTE — Telephone Encounter (Signed)
Prior auth form faxed to [place]

## 2020-07-08 NOTE — Telephone Encounter (Signed)
[place] pre auth rep from [place] is requesting a pre authorization for the pt's Humira, caller states the authorization needs to be initiated through [place].  Once approved can reach Haivana Nakya [phone]  opt 3 Reference CALL #: [id number]

## 2020-07-12 NOTE — Telephone Encounter (Signed)
Patient has new insurance and his pharmacy benefit according to Christella Scheuermann is now [employer] [phone].  I spoke with [employer] and they are the new rx insurance.  Patient can still have his Humira filled through [facility], but they perform the prior auth.  They are sending the form  I left a message for the patient to call back and discuss

## 2020-07-15 NOTE — Telephone Encounter (Signed)
Approval is effective 07/08/20-07/15/2021 Faxed to [facility] at [phone]

## 2020-10-07 ENCOUNTER — Other Ambulatory Visit: Payer: Self-pay | Admitting: Gastroenterology

## 2020-10-07 NOTE — Telephone Encounter (Signed)
Inbound call from pharmacy states patient is out of Humira medication and is requests refills.  Please advise.

## 2020-11-12 ENCOUNTER — Other Ambulatory Visit: Payer: Self-pay | Admitting: Gastroenterology

## 2020-12-24 ENCOUNTER — Encounter: Payer: Self-pay | Admitting: Gastroenterology

## 2021-01-07 ENCOUNTER — Other Ambulatory Visit: Payer: Self-pay | Admitting: Gastroenterology

## 2021-01-08 ENCOUNTER — Other Ambulatory Visit: Payer: Self-pay

## 2021-01-08 MED ORDER — HUMIRA PEN 40 MG/0.8ML ~~LOC~~ PNKT: 40.0000 mg | PEN_INJECTOR | SUBCUTANEOUS | 5 refills | Status: DC

## 2021-02-14 ENCOUNTER — Other Ambulatory Visit: Payer: Self-pay

## 2021-02-14 MED ORDER — HUMIRA PEN 40 MG/0.8ML ~~LOC~~ PNKT: 40.0000 mg | PEN_INJECTOR | SUBCUTANEOUS | 5 refills | Status: DC

## 2021-06-10 ENCOUNTER — Other Ambulatory Visit: Payer: Self-pay | Admitting: Gastroenterology

## 2021-06-10 ENCOUNTER — Telehealth: Payer: BC Managed Care – PPO | Admitting: Physician Assistant

## 2021-06-10 DIAGNOSIS — K5100 Ulcerative (chronic) pancolitis without complications: Secondary | ICD-10-CM

## 2021-06-10 MED ORDER — PREDNISONE 10 MG PO TABS: ORAL_TABLET | ORAL | 0 refills | Status: DC

## 2021-06-10 NOTE — Patient Instructions (Signed)
Suezanne Jacquet, thank you for joining Mar Daring, PA-C for today's virtual visit.  While this provider is not your primary care provider (PCP), if your PCP is located in our provider database this encounter information will be shared with them immediately following your visit.  Consent: (Patient) Keith Jensen provided verbal consent for this virtual visit at the beginning of the encounter.  Current Medications:  Current Outpatient Medications:    Adalimumab (HUMIRA PEN) 40 MG/0.8ML PNKT, Inject 40 mg into the skin every 14 (fourteen) days., Disp: 1 each, Rfl: 5   predniSONE (DELTASONE) 10 MG tablet, Take 50 mg daily for 5 days, then decrease by 15m every 5 days until completed, Disp: 75 tablet, Rfl: 0   Medications ordered in this encounter:  Meds ordered this encounter  Medications   predniSONE (DELTASONE) 10 MG tablet    Sig: Take 50 mg daily for 5 days, then decrease by 139mevery 5 days until completed    Dispense:  75 tablet    Refill:  0    Order Specific Question:   Supervising Provider    Answer:   MINoemi [id number]]     *If you need refills on other medications prior to your next appointment, please contact your pharmacy*  Follow-Up: Call back or seek an in-person evaluation if the symptoms worsen or if the condition fails to improve as anticipated.  Other Instructions Ulcerative Colitis, Adult Ulcerative colitis is [place]-term (chronic) inflammation of the large intestine (colon) and rectum. Sores (ulcers) may also form in these areas. Ulcerative colitis, along with a closely related condition called Crohn's disease, is often referred to as inflammatory bowel disease. What are the causes? This condition may be caused by increased activity of the immune system in the intestines. The immune system is the system that protects the body against harmful bacteria, viruses, fungi, and other things that can make you sick. The cause of the increased activity of the immune  system is not known. What increases the risk? The following factors may make you more likely to develop this condition: Being under 3078ears old. Having a family history of ulcerative colitis. What are the signs or symptoms? Symptoms vary depending on how severe the condition is. Common symptoms include: Rectal bleeding. Diarrhea, often with blood or pus in the stool. Other symptoms can include: Pain or cramping in the abdomen. Fever. Tiredness (fatigue). Nausea, loss of appetite, or weight loss. Rectal pain. A strong and sudden need to have a bowel movement (bowel urgency). Anemia. Yellowing of the skin (jaundice) from liver dysfunction or skin rashes. Symptoms can range from mild to severe. They may come and go. How is this diagnosed? This condition may be diagnosed based on: Your symptoms and medical history. A physical exam. Tests, including: Blood tests and stool tests. X-rays. CT scan. MRI. Colonoscopy. For this test, a flexible tube is inserted into your anus, and your colon is examined. Biopsy. In this test, a tissue sample is taken from your colon and examined under a microscope. How is this treated? There is no cure for this condition, but it can be managed. Treatment depends on the severity of the disease. Treatment for this condition may include medicines to: Decrease swelling and inflammation. Control your immune system. Treat infections. Relieve pain. Control diarrhea. Severe flare-ups may need to be treated at a hospital. Treatment in a hospital may involve: Resting the bowel. This involves not eating or drinking for a period of time. Getting medicines through  an IV. Getting fluids and nutrition through: An IV. A tube that is passed through the nose and into the stomach (nasogastric or NG tube). Surgery to remove the affected part of the colon. This may be done if other treatments are not helping. This condition increases the risk of colon cancer. Adults  with this condition will need to be checked for colon cancer throughout life. Follow these instructions at home: Medicines and vitamins Take over-the-counter and prescription medicines only as told by your health care provider. Do not take aspirin. If you were prescribed an antibiotic medicine, take it as told by your health care provider. Do not stop taking the antibiotic even if you start to feel better. Ask your health care provider if you should take any vitamins or supplements. You may need to take: Calcium and vitamin D for bone health. Iron to help treat anemia. Lifestyle Exercise regularly. Work with your health care provider to manage your condition and educate yourself about your condition. Do not use any products that contain nicotine or tobacco. These products include cigarettes, chewing tobacco, and vaping devices, such as e-cigarettes. If you need help quitting, ask your health care provider. If you drink alcohol: Limit how much you have to: 0-1 drink a day for women who are not pregnant. 0-2 drinks a day for men. Know how much alcohol is in a drink. In the [place] one drink equals one 12 oz bottle of beer (355 mL), one 5 oz glass of wine (148 mL), or one 1 oz glass of hard liquor (44 mL). Eating and drinking Keep a food diary. This may help you identify and avoid any foods that trigger your symptoms. Drink enough fluid to keep your urine pale yellow. Follow a well-balanced diet as told by your health care provider. This may include: Avoiding carbonated drinks. Avoiding popcorn, vegetable skins, nuts, and other high-fiber foods. Avoiding high-fat foods. Eating smaller meals, but more often. Limiting sugary drinks. Limiting caffeine. Follow food safety recommendations as told by your health care provider. This may include making sure you: Avoid eating raw or undercooked meat, fish, or eggs. Do not eat or drink spoiled or expired foods and drinks. General instructions Wash  your hands often with soap and water for at least 20 seconds. If soap and water are not available, use hand sanitizer. Stay up to date on your vaccinations, including a yearly (annual) flu shot. Ask your health care provider which vaccines you should get. Have cancer screening tests as told by your health care provider. Ulcerative colitis may place you at increased risk for colon cancer. Keep all follow-up visits. This is important. Where to find more information You can find some helpful and educational information about ulcerative colitis at the [facility] of Diabetes and Digestive and Kidney Diseases online here: [email or web address] Contact a health care provider if: Your symptoms do not improve or they get worse with treatment. You continue to lose weight. You have constant cramps or loose stools. You develop a new skin rash, skin sores, or eye problems. You have a fever or chills. Get help right away if: You have bloody diarrhea. You have severe bleeding from the rectum. You feel that your heart is racing. You have severe pain in your abdomen. Your abdomen swells (abdominal distension). Your abdomen is tender to the touch. You vomit. Summary Ulcerative colitis is [place]-lasting (chronic) inflammation of the large intestine (colon) and rectum. Sores (ulcers) may also form in these areas. Follow instructions from your health care  provider about medicines, lifestyle changes, and eating and drinking. Contact your health care provider if symptoms do not improve or they get worse with treatment. Get help right away if you have severe abdominal pain, abdominal swelling, or severe bleeding from the rectum. Keep all follow-up visits. This is important. This information is not intended to replace advice given to you by your health care provider. Make sure you discuss any questions you have with your health care provider. Document Revised: 04/02/2020 Document Reviewed: 04/02/2020 Elsevier  Patient Education  2022 [employer].    If you have been instructed to have an in-person evaluation today at a local Urgent Care facility, please use the link below. It will take you to a list of all of our available [facility], including address, phone number and hours of operation. Please do not delay care.  [facility]  If you or a family member do not have a primary care provider, use the link below to schedule a visit and establish care. When you choose a [facility] primary care physician or advanced practice provider, you gain a [place]-term partner in health. Find a Primary Care Provider  Learn more about [facility]'s in-office and virtual care options: [facility]

## 2021-06-10 NOTE — Progress Notes (Signed)
Virtual Visit Consent   [name], you are scheduled for a virtual visit with a [facility] provider today.     Just as with appointments in the office, your consent must be obtained to participate.  Your consent will be active for this visit and any virtual visit you may have with one of our providers in the next 365 days.     If you have a MyChart account, a copy of this consent can be sent to you electronically.  All virtual visits are billed to your insurance company just like a traditional visit in the office.    As this is a virtual visit, video technology does not allow for your provider to perform a traditional examination.  This may limit your provider's ability to fully assess your condition.  If your provider identifies any concerns that need to be evaluated in person or the need to arrange testing (such as labs, EKG, etc.), we will make arrangements to do so.     Although advances in technology are sophisticated, we cannot ensure that it will always work on either your end or our end.  If the connection with a video visit is poor, the visit may have to be switched to a telephone visit.  With either a video or telephone visit, we are not always able to ensure that we have a secure connection.     I need to obtain your verbal consent now.   Are you willing to proceed with your visit today?    [name] has provided verbal consent on 06/10/2021 for a virtual visit (video or telephone).   Mar Daring, PA-C   Date: 06/10/2021 4:55 PM   Virtual Visit via Video Note   I, Mar Daring, connected with  Keith Jensen  ([id number], 09-10-1975) on 06/10/21 at  4:45 PM EDT by a video-enabled telemedicine application and verified that I am speaking with the correct person using two identifiers.  Location: Patient: Virtual Visit Location Patient: Mobile Provider: Virtual Visit Location Provider: Home Office   I discussed the limitations of evaluation and management by  telemedicine and the availability of in person appointments. The patient expressed understanding and agreed to proceed.    History of Present Illness: Keith Jensen is a 45 y.o. who identifies as a male who was assigned male at birth, and is being seen today for UC flare. Having some abdominal pain. Denies any bloody diarrhea. Reports BM are pretty normal for his baseline. On Humira and reports this helps a lot.    Problems:  Patient Active Problem List   Diagnosis Date Noted   Submandibular gland infection 05/21/2015   Screening    Hypokalemia 06/21/2014   Ulcerative colitis ([place]) 05/22/2014    Allergies:  Allergies  Allergen Reactions   Acyclovir And Related    Medications:  Current Outpatient Medications:    Adalimumab (HUMIRA PEN) 40 MG/0.8ML PNKT, Inject 40 mg into the skin every 14 (fourteen) days., Disp: 1 each, Rfl: 5   predniSONE (DELTASONE) 10 MG tablet, Take 50 mg daily for 5 days, then decrease by 24m every 5 days until completed, Disp: 75 tablet, Rfl: 0  Observations/Objective: Patient is well-developed, well-nourished in no acute distress.  Resting comfortably  Head is normocephalic, atraumatic.  No labored breathing.  Speech is clear and coherent with logical content.  Patient is alert and oriented at baseline.    Assessment and Plan: 1. Ulcerative pancolitis without complication ([place] - predniSONE (DELTASONE)  10 MG tablet; Take 50 mg daily for 5 days, then decrease by 23m every 5 days until completed  Dispense: 75 tablet; Refill: 0  - UC flare - Prednisone prescribed as above - Advised strict follow up with GI as he is due for f/u appt and colonoscopy; he agrees  Follow Up Instructions: I discussed the assessment and treatment plan with the patient. The patient was provided an opportunity to ask questions and all were answered. The patient agreed with the plan and demonstrated an understanding of the instructions.  A copy of instructions were sent to the  patient via MyChart unless otherwise noted below.    The patient was advised to call back or seek an in-person evaluation if the symptoms worsen or if the condition fails to improve as anticipated.  Time:  I spent 12 minutes with the patient via telehealth technology discussing the above problems/concerns.    JMar Daring PA-C

## 2021-07-14 ENCOUNTER — Other Ambulatory Visit: Payer: Self-pay | Admitting: Gastroenterology

## 2021-07-18 DIAGNOSIS — R079 Chest pain, unspecified: Secondary | ICD-10-CM | POA: Diagnosis not present

## 2021-07-19 DIAGNOSIS — R9431 Abnormal electrocardiogram [ECG] [EKG]: Secondary | ICD-10-CM | POA: Diagnosis not present

## 2021-07-23 ENCOUNTER — Telehealth: Payer: Self-pay | Admitting: Gastroenterology

## 2021-07-23 NOTE — Telephone Encounter (Signed)
Inbound call from patient requesting medication refill for Humira sent to [facility]

## 2021-07-24 ENCOUNTER — Other Ambulatory Visit: Payer: Self-pay

## 2021-07-24 MED ORDER — HUMIRA PEN 40 MG/0.8ML ~~LOC~~ PNKT
40.0000 mg | PEN_INJECTOR | SUBCUTANEOUS | 5 refills | Status: DC
Start: 2021-07-24 — End: 2021-12-05

## 2021-07-24 NOTE — Telephone Encounter (Signed)
Refill sent.

## 2021-10-23 ENCOUNTER — Other Ambulatory Visit: Payer: Self-pay | Admitting: Physician Assistant

## 2021-10-24 ENCOUNTER — Telehealth: Payer: BC Managed Care – PPO

## 2021-10-24 ENCOUNTER — Telehealth: Payer: BC Managed Care – PPO | Admitting: Physician Assistant

## 2021-10-24 DIAGNOSIS — K5100 Ulcerative (chronic) pancolitis without complications: Secondary | ICD-10-CM

## 2021-10-24 MED ORDER — PREDNISONE 10 MG PO TABS: ORAL_TABLET | ORAL | 0 refills | Status: DC

## 2021-10-24 NOTE — Progress Notes (Signed)
We are sorry that you are not feeling well.  Here is how we plan to help! ? ?Based on what you have shared with me it looks like you have Acute Infectious Diarrhea. ? ?Most cases of acute diarrhea are due to infections with virus and bacteria and are self-limited conditions lasting less than 14 days. ? ?For your symptoms you may take Imodium 2 mg tablets that are over the counter at your local pharmacy. Take two tablet now and then one after each loose stool up to 6 a day.  ?Antibiotics are not needed for most people with diarrhea. ? ?I have prescribed Prednisone 29m Take 50 mg daily for 5 days, then decrease by 157mevery 5 days until completed for the UC flare.  ? ? ?HOME CARE ?We recommend changing your diet to help with your symptoms for the next few days. ?Drink plenty of fluids that contain water salt and sugar. Sports drinks such as Gatorade may help.  ?You may try broths, soups, bananas, applesauce, soft breads, mashed potatoes or crackers.  ?You are considered infectious for as [place] as the diarrhea continues. Hand washing or use of alcohol based hand sanitizers is recommend. ?It is best to stay out of work or school until your symptoms stop.  ? ?GET HELP RIGHT AWAY ?If you have dark yellow colored urine or do not pass urine frequently you should drink more fluids.   ?If your symptoms worsen  ?If you feel like you are going to pass out (faint) ?You have a new problem ? ?MAKE SURE YOU  ?Understand these instructions. ?Will watch your condition. ?Will get help right away if you are not doing well or get worse. ? ?Thank you for choosing an e-visit. ? ?Your e-visit answers were reviewed by a board certified advanced clinical practitioner to complete your personal care plan. Depending upon the condition, your plan could have included both over the counter or prescription medications. ? ?Please review your pharmacy choice. Make sure the pharmacy is open so you can pick up prescription now. If there is a problem,  you may contact your provider through [employer] have the prescription routed to another pharmacy.  Your safety is important to usKoreaIf you have drug allergies check your prescription carefully.  ? ?For the next 24 hours you can use MyChart to ask questions about today's visit, request a non-urgent call back, or ask for a work or school excuse. ?You will get an email in the next two days asking about your experience. I hope that your e-visit has been valuable and will speed your recovery. ? ?I provided 5 minutes of non face-to-face time during this encounter for chart review and documentation.  ? ?

## 2021-10-27 NOTE — Telephone Encounter (Signed)
I am confused.  It looks like someone saw him for an E-visit 3 days ago and prescribed prednisone.  His this is a request for more?  Should I ignore this? ?

## 2021-12-05 ENCOUNTER — Other Ambulatory Visit: Payer: Self-pay

## 2021-12-05 ENCOUNTER — Telehealth: Payer: Self-pay

## 2021-12-05 MED ORDER — HUMIRA PEN 40 MG/0.8ML ~~LOC~~ PNKT: 40.0000 mg | PEN_INJECTOR | SUBCUTANEOUS | 1 refills | Status: DC

## 2021-12-05 NOTE — Telephone Encounter (Signed)
Patient called in overnight requesting Humira refill. Patient was last seen for OV with Dr. Fuller Plan on 04/29/20. Will route to MD.  ?

## 2021-12-05 NOTE — Telephone Encounter (Signed)
OK to provide refills until office appt with me or an APP within 6 weeks. ?

## 2021-12-05 NOTE — Telephone Encounter (Signed)
Follow up scheduled for 01/14/22 at 2:30 with Dr. Fuller Plan. Humira refill sent in & patient notified. ?

## 2021-12-08 ENCOUNTER — Telehealth: Payer: Self-pay

## 2021-12-08 NOTE — Telephone Encounter (Signed)
Left message for patient to call back  

## 2021-12-09 NOTE — Telephone Encounter (Signed)
Spoke with patient about Humira refill & follow up appointment. No further questions. ?

## 2021-12-30 ENCOUNTER — Telehealth: Payer: Self-pay | Admitting: Gastroenterology

## 2021-12-30 NOTE — Telephone Encounter (Signed)
Received a call from [place], Nurse Case Manager for [employer].  She wanted you to have her information in case there was anything she could do to assist in this patient's Plan of Care.  The phone number to reach her is:  [phone] Ext. [phone]  Thank you.

## 2022-01-14 ENCOUNTER — Other Ambulatory Visit (INDEPENDENT_AMBULATORY_CARE_PROVIDER_SITE_OTHER): Payer: BC Managed Care – PPO

## 2022-01-14 ENCOUNTER — Ambulatory Visit (INDEPENDENT_AMBULATORY_CARE_PROVIDER_SITE_OTHER): Payer: BC Managed Care – PPO | Admitting: Gastroenterology

## 2022-01-14 ENCOUNTER — Encounter: Payer: Self-pay | Admitting: Gastroenterology

## 2022-01-14 VITALS — BP 138/90 | HR 85 | Ht 70.0 in | Wt 190.0 lb

## 2022-01-14 DIAGNOSIS — K5100 Ulcerative (chronic) pancolitis without complications: Secondary | ICD-10-CM

## 2022-01-14 LAB — COMPREHENSIVE METABOLIC PANEL
ALT: 12 U/L (ref 0–53)
AST: 15 U/L (ref 0–37)
Albumin: 4 g/dL (ref 3.5–5.2)
Alkaline Phosphatase: 46 U/L (ref 39–117)
BUN: 12 mg/dL (ref 6–23)
CO2: 29 mEq/L (ref 19–32)
Calcium: 9.2 mg/dL (ref 8.4–10.5)
Chloride: 101 mEq/L (ref 96–112)
Creatinine, Ser: 1.22 mg/dL (ref 0.40–1.50)
GFR: 71.24 mL/min (ref >60.00)
Glucose, Bld: 84 mg/dL (ref 70–99)
Potassium: 3.9 mEq/L (ref 3.5–5.1)
Sodium: 137 mEq/L (ref 135–145)
Total Bilirubin: 0.5 mg/dL (ref 0.2–1.2)
Total Protein: 7.5 g/dL (ref 6.0–8.3)

## 2022-01-14 LAB — CBC WITH DIFFERENTIAL/PLATELET
Basophils Absolute: 0 10*3/uL (ref 0.0–0.1)
Basophils Relative: 0.6 % (ref 0.0–3.0)
Eosinophils Absolute: 0.3 10*3/uL (ref 0.0–0.7)
Eosinophils Relative: 5.3 % — ABNORMAL HIGH (ref 0.0–5.0)
HCT: 46.3 % (ref 39.0–52.0)
Hemoglobin: 15.4 g/dL (ref 13.0–17.0)
Lymphocytes Relative: 37.1 % (ref 12.0–46.0)
Lymphs Abs: 1.9 10*3/uL (ref 0.7–4.0)
MCHC: 33.3 g/dL (ref 30.0–36.0)
MCV: 93.7 fl (ref 78.0–100.0)
Monocytes Absolute: 0.7 10*3/uL (ref 0.1–1.0)
Monocytes Relative: 13.6 % — ABNORMAL HIGH (ref 3.0–12.0)
Neutro Abs: 2.3 10*3/uL (ref 1.4–7.7)
Neutrophils Relative %: 43.4 % (ref 43.0–77.0)
Platelets: 207 10*3/uL (ref 150.0–400.0)
RBC: 4.94 Mil/uL (ref 4.22–5.81)
RDW: 13.9 % (ref 11.5–15.5)
WBC: 5.2 10*3/uL (ref 4.0–10.5)

## 2022-01-14 LAB — C-REACTIVE PROTEIN: CRP: 2.5 mg/dL (ref 0.5–20.0)

## 2022-01-14 LAB — SEDIMENTATION RATE: Sed Rate: 34 mm/hr — ABNORMAL HIGH (ref 0–15)

## 2022-01-14 MED ORDER — NA SULFATE-K SULFATE-MG SULF 17.5-3.13-1.6 GM/177ML PO SOLN: 1.0000 | Freq: Once | ORAL | 0 refills | Status: AC

## 2022-01-14 NOTE — Progress Notes (Signed)
    Assessment     Ulcerative pancolitis maintained on Humira   Recommendations    CBC, CMP, ESR, CRP Continue Humira 40 mg sq every 14 days Schedule colonoscopy. The risks (including bleeding, perforation, infection, missed lesions, medication reactions and possible hospitalization or surgery if complications occur), benefits, and alternatives to colonoscopy with possible biopsy and possible polypectomy were discussed with the patient and they consent to proceed.   We discussed the importance of compliance with recommended office and colonoscopy follow-up.  The maximum interval for office appointments is 1 year, which could be shortened based on how well his colitis is controlled.  His colonoscopy follow-ups will likely be in the 2 to 3-year range.  I stressed the importance of proper management and monitoring of his ulcerative colitis.   HPI    This is a 46 year old male returning for follow up of ulcerative pancolitis.  He has no recent gastrointestinal complaints. No recent problems with diarrhea, rectal bleeding, abdominal pain.  He has intentionally lost weight and he is comfortable at his current weight.  His last office visit was in Sept 2021, 9 months overdue.  His last colonoscopy was in August 2019, 22 months overdue.   Labs / Imaging       Latest Ref Rng & Units 04/29/2020    3:38 PM 08/19/2017   12:01 PM 01/05/2017    3:07 PM  Hepatic Function  Total Protein 6.0 - 8.3 g/dL 8.4   8.5   8.1    Albumin 3.5 - 5.2 g/dL 4.6   4.5   4.4    AST 0 - 37 U/L 13   14   14     ALT 0 - 53 U/L 19   22   15     Alk Phosphatase 39 - 117 U/L 55   63   52    Total Bilirubin 0.2 - 1.2 mg/dL 0.5   0.5   0.4         Latest Ref Rng & Units 04/29/2020    3:38 PM 08/19/2017   12:01 PM 01/05/2017    3:07 PM  CBC  WBC 4.0 - 10.5 K/uL 7.0   4.5   5.7    Hemoglobin 13.0 - 17.0 g/dL 15.9   16.2   15.8    Hematocrit 39.0 - 52.0 % 47.0   48.7   46.8    Platelets 150.0 - 400.0 K/uL 220.0   243.0    273.0      Current Medications, Allergies, Past Medical History, Past Surgical History, Family History and Social History were reviewed in [employer] record.   Physical Exam: General: Well developed, well nourished, no acute distress Head: Normocephalic and atraumatic Eyes: Sclerae anicteric, EOMI Ears: Normal auditory acuity Mouth: Not examined Lungs: Clear throughout to auscultation Heart: Regular rate and rhythm; no murmurs, rubs or bruits Abdomen: Soft, non tender and non distended. No masses, hepatosplenomegaly or hernias noted. Normal Bowel sounds Rectal: Deferred to colonoscopy Musculoskeletal: Symmetrical with no gross deformities  Pulses:  Normal pulses noted Extremities: No clubbing, cyanosis, edema or deformities noted Neurological: Alert oriented x 4, grossly nonfocal Psychological:  Alert and cooperative. Normal mood and affect   Anaston Koehn T. Fuller Plan, MD 01/14/2022, 2:59 PM

## 2022-01-14 NOTE — Patient Instructions (Signed)
Your provider has requested that you go to the basement level for lab work before leaving today. Press "B" on the elevator. The lab is located at the first door on the left as you exit the elevator.  You have been scheduled for a colonoscopy. Please follow written instructions given to you at your visit today.  Please pick up your prep supplies at the pharmacy within the next 1-3 days. If you use inhalers (even only as needed), please bring them with you on the day of your procedure.  The [facility] providers would like to encourage you to use [facility] to communicate with providers for non-urgent requests or questions.  Due to [place] hold times on the telephone, sending your provider a message by [facility] may be a faster and more efficient way to get a response.  Please allow 48 business hours for a response.  Please remember that this is for non-urgent requests.   Due to recent changes in healthcare laws, you may see the results of your imaging and laboratory studies on MyChart before your provider has had a chance to review them.  We understand that in some cases there may be results that are confusing or concerning to you. Not all laboratory results come back in the same time frame and the provider may be waiting for multiple results in order to interpret others.  Please give [place] 48 hours in order for your provider to thoroughly review all the results before contacting the office for clarification of your results.   Thank you for choosing me and [facility].  Pricilla Riffle. Dagoberto Ligas., MD., Marval Regal

## 2022-01-23 ENCOUNTER — Other Ambulatory Visit (HOSPITAL_COMMUNITY): Payer: Self-pay

## 2022-01-23 ENCOUNTER — Telehealth: Payer: Self-pay | Admitting: Pharmacy Technician

## 2022-01-23 NOTE — Telephone Encounter (Signed)
Patient Advocate Encounter Received notification from [facility] regarding a prior authorization for HUMIRA. Authorization has been APPROVED from 6.16.23 to 6.15.24.    Authorization # PA Case: [id number]      Received notification from [facility] that prior authorization for HUMIRA 40MG is due for renewal.   PA submitted on 6.16.23 Key [id number] Status is pending    [facility] will continue to follow:

## 2022-02-02 ENCOUNTER — Other Ambulatory Visit: Payer: Self-pay | Admitting: Gastroenterology

## 2022-03-05 ENCOUNTER — Encounter: Payer: Self-pay | Admitting: Gastroenterology

## 2022-03-06 ENCOUNTER — Telehealth: Payer: BC Managed Care – PPO | Admitting: Physician Assistant

## 2022-03-06 DIAGNOSIS — H66001 Acute suppurative otitis media without spontaneous rupture of ear drum, right ear: Secondary | ICD-10-CM

## 2022-03-06 MED ORDER — AMOXICILLIN-POT CLAVULANATE 875-125 MG PO TABS: 1.0000 | ORAL_TABLET | Freq: Two times a day (BID) | ORAL | 0 refills | Status: DC

## 2022-03-06 NOTE — Progress Notes (Signed)
E Visit for Swimmer's Ear  We are sorry that you are not feeling well. Here is how we plan to help!  Based on what you have told me you may have a bacterial inner ear infection. In addition to the ear drops I have prescribed an oral antibiotic: and Augmentin 875-133m one tablet by mouth twice a day for 10 days  If you have a fever 102 and up and significantly worsening symptoms, this could indicate a more serious infection moving to the middle/inner and needs face to face evaluation in an office by a provider.  Your symptoms should improve over the next 3 days and should resolve in about 7 days.  HOME CARE:  Wash your hands frequently. Do not place the tip of the bottle on your ear or touch it with your fingers. You can take Acetominophen 650 mg every 4-6 hours as needed for pain.  If pain is severe or moderate, you can apply a heating pad (set on low) or hot water bottle (wrapped in a towel) to outer ear for 20 minutes.  This will also increase drainage. Avoid ear plugs Do not use Q-tips After showers, help the water run out by tilting your head to one side.  GET HELP RIGHT AWAY IF:  Fever is over 102.2 degrees. You develop progressive ear pain or hearing loss. Ear symptoms persist longer than 3 days after treatment.  MAKE SURE YOU:  Understand these instructions. Will watch your condition. Will get help right away if you are not doing well or get worse.  TO PREVENT SWIMMER'S EAR: Use a bathing cap or custom fitted swim molds to keep your ears dry. Towel off after swimming to dry your ears. Tilt your head or pull your earlobes to allow the water to escape your ear canal. If there is still water in your ears, consider using a hairdryer on the lowest setting.   Thank you for choosing an e-visit.  Your e-visit answers were reviewed by a board certified advanced clinical practitioner to complete your personal care plan. Depending upon the condition, your plan could have included  both over the counter or prescription medications.  Please review your pharmacy choice. Make sure the pharmacy is open so you can pick up prescription now. If there is a problem, you may contact your provider through [employer] have the prescription routed to another pharmacy.  Your safety is important to [place] If you have drug allergies check your prescription carefully.   For the next 24 hours you can use MyChart to ask questions about today's visit, request a non-urgent call back, or ask for a work or school excuse. You will get an email in the next two days asking about your experience. I hope that your e-visit has been valuable and will speed your recovery.   I provided 5 minutes of non face-to-face time during this encounter for chart review and documentation.

## 2022-03-12 ENCOUNTER — Encounter: Payer: Self-pay | Admitting: Gastroenterology

## 2022-03-12 ENCOUNTER — Ambulatory Visit (AMBULATORY_SURGERY_CENTER): Payer: BC Managed Care – PPO | Admitting: Gastroenterology

## 2022-03-12 VITALS — BP 123/79 | HR 80 | Temp 98.9°F | Resp 18 | Ht 70.0 in | Wt 190.0 lb

## 2022-03-12 DIAGNOSIS — K635 Polyp of colon: Secondary | ICD-10-CM

## 2022-03-12 DIAGNOSIS — K640 First degree hemorrhoids: Secondary | ICD-10-CM

## 2022-03-12 DIAGNOSIS — K5100 Ulcerative (chronic) pancolitis without complications: Secondary | ICD-10-CM

## 2022-03-12 DIAGNOSIS — D124 Benign neoplasm of descending colon: Secondary | ICD-10-CM

## 2022-03-12 MED ORDER — SODIUM CHLORIDE 0.9 % IV SOLN: 500.0000 mL | Freq: Once | INTRAVENOUS | Status: DC

## 2022-03-12 NOTE — Patient Instructions (Signed)
Handouts provided on polyps and hemorrhoids.   YOU HAD AN ENDOSCOPIC PROCEDURE TODAY AT [facility]:   Refer to the procedure report that was given to you for any specific questions about what was found during the examination.  If the procedure report does not answer your questions, please call your gastroenterologist to clarify.  If you requested that your care partner not be given the details of your procedure findings, then the procedure report has been included in a sealed envelope for you to review at your convenience later.  YOU SHOULD EXPECT: Some feelings of bloating in the abdomen. Passage of more gas than usual.  Walking can help get rid of the air that was put into your GI tract during the procedure and reduce the bloating. If you had a lower endoscopy (such as a colonoscopy or flexible sigmoidoscopy) you may notice spotting of blood in your stool or on the toilet paper. If you underwent a bowel prep for your procedure, you may not have a normal bowel movement for a few days.  Please Note:  You might notice some irritation and congestion in your nose or some drainage.  This is from the oxygen used during your procedure.  There is no need for concern and it should clear up in a day or so.  SYMPTOMS TO REPORT IMMEDIATELY:  Following lower endoscopy (colonoscopy or flexible sigmoidoscopy):  Excessive amounts of blood in the stool  Significant tenderness or worsening of abdominal pains  Swelling of the abdomen that is new, acute  Fever of 100F or higher  For urgent or emergent issues, a gastroenterologist can be reached at any hour by calling [phone]. Do not use MyChart messaging for urgent concerns.    DIET:  We do recommend a small meal at first, but then you may proceed to your regular diet.  Drink plenty of fluids but you should avoid alcoholic beverages for 24 hours.  ACTIVITY:  You should plan to take it easy for the rest of today and you should NOT DRIVE  or use heavy machinery until tomorrow (because of the sedation medicines used during the test).    FOLLOW UP: Our staff will call the number listed on your records the next business day following your procedure.  We will call around 7:15- 8:00 am to check on you and address any questions or concerns that you may have regarding the information given to you following your procedure. If we do not reach you, we will leave a message.  If you develop any symptoms (ie: fever, flu-like symptoms, shortness of breath, cough etc.) before then, please call [phone].  If you test positive for Covid 19 in the 2 weeks post procedure, please call and report this information to [place].    If any biopsies were taken you will be contacted by phone or by letter within the next 1-3 weeks.  Please call us at [phone] if you have not heard about the biopsies in 3 weeks.    SIGNATURES/CONFIDENTIALITY: You and/or your care partner have signed paperwork which will be entered into your electronic medical record.  These signatures attest to the fact that that the information above on your After Visit Summary has been reviewed and is understood.  Full responsibility of the confidentiality of this discharge information lies with you and/or your care-partner.

## 2022-03-12 NOTE — Progress Notes (Signed)
History & Physical  Primary Care Physician:  Tammi Sou, MD Primary Gastroenterologist: Lucio Edward, MD  CHIEF COMPLAINT:  Ulcerative pancolitis   HPI: Keith Jensen is a 46 y.o. male with ulcerative pancolitis for surveillance colonoscopy.   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  [facility] notes state that since 2001 he had multiple episodes of proctitis, proctoscopy done 2005 but biopsies not performed, then pt referred to GI and diagnostic colonoscopy was recommended but pt failed to schedule this b/c his bleeding resolved (most recent GI visit as per their old notes is 01/18/2012, with Dr. Wynetta Emery)   Ulcerative colitis ([place])    Indeterminate quantiferon gold TB test, CXR normal.  Cleared for humira 06/2014.  Stable on Humira 2019.  Surveillance colonoscopy 03/2018-->mild colitis, path +chronic active colitis.    Past Surgical History:  Procedure Laterality Date   COLONOSCOPY N/A 06/22/2014   Procedure: COLONOSCOPY;  Surgeon: Milus Banister, MD;  Location: [facility];  Service: Endoscopy;  Laterality: N/A;   COLONOSCOPY  03/2018   Mild colitis on endoscopy, path+ chronic active colitis.    Prior to Admission medications   Medication Sig Start Date End Date Taking? Authorizing Provider  amoxicillin-clavulanate (AUGMENTIN) 875-125 MG tablet Take 1 tablet by mouth 2 (two) times daily. 03/06/22  Yes Burnette, Jennifer M, PA-C  HUMIRA PEN 40 MG/0.8ML PNKT INJECT 1 PEN UNDER THE SKIN EVERY 14 DAYS 02/03/22  Yes Ladene Artist, MD    Current Outpatient Medications  Medication Sig Dispense Refill   amoxicillin-clavulanate (AUGMENTIN) 875-125 MG tablet Take 1 tablet by mouth 2 (two) times daily. 14 tablet 0   HUMIRA PEN 40 MG/0.8ML PNKT INJECT 1 PEN UNDER  THE SKIN EVERY 14 DAYS 2 each 1   Current Facility-Administered Medications  Medication Dose Route Frequency Provider Last Rate Last Admin   0.9 %  sodium chloride infusion  500 mL Intravenous Once Ladene Artist, MD        Allergies as of 03/12/2022 - Review Complete 03/12/2022  Allergen Reaction Noted   Acyclovir and related  06/23/2014    Family History  Problem Relation Age of Onset   Heart attack Mother        per pt she had accidental overdose   Colon polyps Father    Colon cancer Neg Hx    Esophageal cancer Neg Hx    Rectal cancer Neg Hx    Stomach cancer Neg Hx     Social History   Socioeconomic History   Marital status: Married    Spouse name: Not on file   Number of children: 0   Years of education: Not on file   Highest education level: Not on file  Occupational History   Occupation: finance  Tobacco Use   Smoking status: Never   Smokeless tobacco: Never  Vaping Use   Vaping Use: Never used  Substance and Sexual Activity   Alcohol use: Yes    Alcohol/week: 1.0 standard drink of alcohol    Types: 1 Cans of beer per week    Comment: 2-3 times weekly   Drug use: No   Sexual activity: Not on file  Other Topics Concern   Not on file  Social History Narrative   Married,  no children.  Father healthy, sister healthy.  Mom d. Accidental overdose.   Education: Scientist, product/process development.   Occupation: Engineer, mining.   No tobacco.  Beer 2-3 times per week.  No drugs.   Exercises: lifting wt's and biking.   Social Determinants of Health   Financial Resource Strain: Not on file  Food Insecurity: Not on file  Transportation Needs: Not on file  Physical Activity: Not on file  Stress: Not on file  Social Connections: Not on file  Intimate Partner Violence: Not on file    Review of Systems:  All systems reviewed were negative except where noted in HPI.   Physical Exam: General:  Alert, well-developed, in NAD Head:  Normocephalic and atraumatic. Eyes:   Sclera clear, no icterus.   Conjunctiva pink. Ears:  Normal auditory acuity. Mouth:  No deformity or lesions.  Neck:  Supple; no masses . Lungs:  Clear throughout to auscultation.   No wheezes, crackles, or rhonchi. No acute distress. Heart:  Regular rate and rhythm; no murmurs. Abdomen:  Soft, nondistended, nontender. No masses, hepatomegaly. No obvious masses.  Normal bowel .    Rectal:  Deferred   Msk:  Symmetrical without gross deformities.. Pulses:  Normal pulses noted. Extremities:  Without edema. Neurologic:  Alert and  oriented x4;  grossly normal neurologically. Skin:  Intact without significant lesions or rashes. Cervical Nodes:  No significant cervical adenopathy. Psych:  Alert and cooperative. Normal mood and affect.   Impression / Plan:   Ulcerative pancolitis for surveillance colonoscopy.  Pricilla Riffle. Fuller Plan  03/12/2022, 9:00 AM See Shea Evans, [facility], to contact our on call provider

## 2022-03-12 NOTE — Op Note (Signed)
[facility] Patient Name: Keith Jensen Procedure Date: 03/12/2022 8:55 AM MRN: [id number] Endoscopist: Ladene Artist , MD Age: 46 Referring MD:  Date of Birth: 1975-12-30 Gender: Male Account #: [id number] Procedure:                Colonoscopy Indications:              High risk colon cancer surveillance: Ulcerative                            pancolitis of 8 (or more) years duration Medicines:                Monitored Anesthesia Care Procedure:                Pre-Anesthesia Assessment:                           - Prior to the procedure, a History and Physical                            was performed, and patient medications and                            allergies were reviewed. The patient's tolerance of                            previous anesthesia was also reviewed. The risks                            and benefits of the procedure and the sedation                            options and risks were discussed with the patient.                            All questions were answered, and informed consent                            was obtained. Prior Anticoagulants: The patient has                            taken no previous anticoagulant or antiplatelet                            agents. ASA Grade Assessment: II - A patient with                            mild systemic disease. After reviewing the risks                            and benefits, the patient was deemed in                            satisfactory condition to undergo the procedure.  After obtaining informed consent, the colonoscope                            was passed under direct vision. Throughout the                            procedure, the patient's blood pressure, pulse, and                            oxygen saturations were monitored continuously. The                            CF [id number] #[id number] was introduced through the anus                            and advanced to the the  cecum, identified by                            appendiceal orifice and ileocecal valve. The                            ileocecal valve, appendiceal orifice, and rectum                            were photographed. The quality of the bowel                            preparation was good. The colonoscopy was performed                            without difficulty. The patient tolerated the                            procedure well. Scope In: 9:08:27 AM Scope Out: 9:23:10 AM Scope Withdrawal Time: 0 hours 10 minutes 46 seconds  Total Procedure Duration: 0 hours 14 minutes 43 seconds  Findings:                 The perianal and digital rectal examinations were                            normal.                           A diffuse area of moderately pseudopolypoid,                            scarred and vascular-pattern-decreased mucosa was                            found in the sigmoid colon, in the descending                            colon, at the splenic flexure and in the transverse  colon. Biopsies were taken with a cold forceps for                            histology.                           A 6 mm polyp was found in the descending colon. The                            polyp was sessile. The polyp was removed with a                            cold snare. Resection and retrieval were complete.                           Internal hemorrhoids were found during                            retroflexion. The hemorrhoids were small and Grade                            I (internal hemorrhoids that do not prolapse).                           The exam was otherwise without abnormality on                            direct and retroflexion views. Complications:            No immediate complications. Estimated blood loss:                            None. Estimated Blood Loss:     Estimated blood loss: none. Impression:               - Pseudopolypoid, scarred and                             vascular-pattern-decreased mucosa in the sigmoid                            colon, in the descending colon, at the splenic                            flexure and in the transverse colon. Biopsied.                           - Descending colon colon polyp. Removed and                            retrieved with a cold snare                           - Internal hemorrhoids.                           -  The examination was otherwise normal on direct                            and retroflexion views. Recommendation:           - Repeat colonoscopy date to be determined, likely                            3 years, after pending pathology results are                            reviewed for surveillance based on pathology                            results.                           - Patient has a contact number available for                            emergencies. The signs and symptoms of potential                            delayed complications were discussed with the                            patient. Return to normal activities tomorrow.                            Written discharge instructions were provided to the                            patient.                           - Resume previous diet.                           - Continue present medications.                           - Await pathology results. Ladene Artist, MD 03/12/2022 9:29:50 AM This report has been signed electronically.

## 2022-03-12 NOTE — Progress Notes (Signed)
Called to room to assist during endoscopic procedure.  Patient ID and intended procedure confirmed with present staff. Received instructions for my participation in the procedure from the performing physician.  

## 2022-03-12 NOTE — Progress Notes (Signed)
Vs by CW in adm   Pt's states no medical or surgical changes since previsit or office visit. Except recent rt ear infection

## 2022-03-12 NOTE — Progress Notes (Signed)
Report to PACU, RN, vss, BBS= Clear.  

## 2022-03-13 ENCOUNTER — Telehealth: Payer: Self-pay

## 2022-03-13 NOTE — Telephone Encounter (Signed)
Attempted f/u call. No answer, left VM.

## 2022-03-30 ENCOUNTER — Other Ambulatory Visit: Payer: Self-pay | Admitting: Gastroenterology

## 2022-04-01 ENCOUNTER — Encounter: Payer: Self-pay | Admitting: Gastroenterology

## 2022-04-06 ENCOUNTER — Encounter: Payer: Self-pay | Admitting: Gastroenterology

## 2022-04-23 ENCOUNTER — Encounter: Payer: Self-pay | Admitting: Family Medicine

## 2022-05-04 ENCOUNTER — Other Ambulatory Visit: Payer: Self-pay

## 2022-05-04 ENCOUNTER — Telehealth: Payer: BC Managed Care – PPO | Admitting: Physician Assistant

## 2022-05-04 ENCOUNTER — Encounter: Payer: Self-pay | Admitting: Gastroenterology

## 2022-05-04 ENCOUNTER — Encounter: Payer: Self-pay | Admitting: Physician Assistant

## 2022-05-04 DIAGNOSIS — R197 Diarrhea, unspecified: Secondary | ICD-10-CM

## 2022-05-04 DIAGNOSIS — K921 Melena: Secondary | ICD-10-CM

## 2022-05-04 MED ORDER — BUDESONIDE 3 MG PO CPEP: ORAL_CAPSULE | ORAL | 0 refills | Status: DC

## 2022-05-04 NOTE — Progress Notes (Addendum)
Because of your history of ulcerative colitis and your current symptoms of bloody stools and diarrhea, I feel your condition warrants further evaluation and I recommend that you be seen in a face to face visit. You need to have a physical exam and will also likely need to have labwork completed.   NOTE: There will be NO CHARGE for this eVisit   If you are having a true medical emergency please call 911.      For an urgent face to face visit, [facility] has seven urgent care centers for your convenience:     [facility] [place] Get Driving Directions [phone] [address], [place] [address]    [facility]) Get Driving Directions [phone] [place], [place] [address]  [facility] [place] [facility]) Get Driving Directions [phone] [address] [place],  [place]  [address]  [facility] [address] Get Driving Directions  [phone] [address] [place],  [place] [address]   [facility] Get Driving Directions [phone] [address] [place], [place], [place] [address]   [facility] Get Driving Directions  [phone] [address] [place], [place] [address]   [facility] [place] Get Driving Directions [phone] [address] [place], [place] [address]  Your MyChart E-visit questionnaire answers were reviewed by a board certified advanced clinical practitioner to complete your personal care plan based on your specific symptoms.  Thank you for using e-Visits.   Approximately 5 minutes was spent documenting and reviewing patient's chart.

## 2022-05-05 ENCOUNTER — Other Ambulatory Visit: Payer: Self-pay

## 2022-05-05 MED ORDER — BUDESONIDE 3 MG PO CPEP: 3.0000 mg | ORAL_CAPSULE | Freq: Every day | ORAL | 0 refills | Status: DC

## 2022-06-01 ENCOUNTER — Other Ambulatory Visit: Payer: Self-pay | Admitting: Gastroenterology

## 2022-07-13 ENCOUNTER — Other Ambulatory Visit: Payer: Self-pay | Admitting: Gastroenterology

## 2022-07-30 ENCOUNTER — Telehealth: Payer: BC Managed Care – PPO | Admitting: Emergency Medicine

## 2022-07-30 DIAGNOSIS — J111 Influenza due to unidentified influenza virus with other respiratory manifestations: Secondary | ICD-10-CM

## 2022-07-30 MED ORDER — OSELTAMIVIR PHOSPHATE 75 MG PO CAPS: 75.0000 mg | ORAL_CAPSULE | Freq: Two times a day (BID) | ORAL | 0 refills | Status: DC

## 2022-07-30 NOTE — Progress Notes (Signed)
E visit for Flu like symptoms   We are sorry that you are not feeling well.  Here is how we plan to help! Based on what you have shared with me it looks like you may have a respiratory virus that may be influenza.  Influenza or "the flu" is   an infection caused by a respiratory virus. The flu virus is highly contagious and persons who did not receive their yearly flu vaccination may "catch" the flu from close contact.  We have anti-viral medications to treat the viruses that cause this infection. They are not a "cure" and only shorten the course of the infection. These prescriptions are most effective when they are given within the first 2 days of "flu" symptoms. Antiviral medication are indicated if you have a high risk of complications from the flu. You should  also consider an antiviral medication if you are in close contact with someone who is at risk. These medications can help patients avoid complications from the flu  but have side effects that you should know. Possible side effects from Tamiflu or oseltamivir include nausea, vomiting, diarrhea, dizziness, headaches, eye redness, sleep problems or other respiratory symptoms. You should not take Tamiflu if you have an allergy to oseltamivir or any to the ingredients in Tamiflu.  Based upon your symptoms and potential risk factors I have prescribed Oseltamivir (Tamiflu).  It has been sent to your designated pharmacy.  You will take one 75 mg capsule orally twice a day for the next 5 days.  ANYONE WHO HAS FLU SYMPTOMS SHOULD: Stay home. The flu is highly contagious and going out or to work exposes others! Be sure to drink plenty of fluids. Water is fine as well as fruit juices, sodas and electrolyte beverages. You may want to stay away from caffeine or alcohol. If you are nauseated, try taking small sips of liquids. How do you know if you are getting enough fluid? Your urine should be a pale yellow or almost colorless. Get rest. Taking a steamy  shower or using a humidifier may help nasal congestion and ease sore throat pain. Using a saline nasal spray works much the same way. Cough drops, hard candies and sore throat lozenges may ease your cough. Line up a caregiver. Have someone check on you regularly.   GET HELP RIGHT AWAY IF: You cannot keep down liquids or your medications. You become short of breath Your fell like you are going to pass out or loose consciousness. Your symptoms persist after you have completed your treatment plan MAKE SURE YOU  Understand these instructions. Will watch your condition. Will get help right away if you are not doing well or get worse.  Your e-visit answers were reviewed by a board certified advanced clinical practitioner to complete your personal care plan.  Depending on the condition, your plan could have included both over the counter or prescription medications.  If there is a problem please reply  once you have received a response from your provider.  Your safety is important to [place].  If you have drug allergies check your prescription carefully.    You can use MyChart to ask questions about today's visit, request a non-urgent call back, or ask for a work or school excuse for 24 hours related to this e-Visit. If it has been greater than 24 hours you will need to follow up with your provider, or enter a new e-Visit to address those concerns.  You will get an e-mail in the next  two days asking about your experience.  I hope that your e-visit has been valuable and will speed your recovery. Thank you for using e-visits.  I have spent 5 minutes in review of e-visit questionnaire, review and updating patient chart, medical decision making and response to patient.   Willeen Cass, PhD, FNP-BC

## 2022-08-11 ENCOUNTER — Encounter: Payer: Self-pay | Admitting: Gastroenterology

## 2022-08-12 ENCOUNTER — Other Ambulatory Visit: Payer: Self-pay

## 2022-08-12 MED ORDER — HUMIRA PEN 40 MG/0.8ML ~~LOC~~ PNKT: PEN_INJECTOR | SUBCUTANEOUS | 1 refills | Status: DC

## 2022-09-29 ENCOUNTER — Other Ambulatory Visit: Payer: Self-pay | Admitting: Gastroenterology

## 2022-10-30 NOTE — Patient Instructions (Signed)
Health Maintenance, Male Adopting a healthy lifestyle and getting preventive care are important in promoting health and wellness. Ask your health care provider about: The right schedule for you to have regular tests and exams. Things you can do on your own to prevent diseases and keep yourself healthy. What should I know about diet, weight, and exercise? Eat a healthy diet  Eat a diet that includes plenty of vegetables, fruits, low-fat dairy products, and lean protein. Do not eat a lot of foods that are high in solid fats, added sugars, or sodium. Maintain a healthy weight Body mass index (BMI) is a measurement that can be used to identify possible weight problems. It estimates body fat based on height and weight. Your health care provider can help determine your BMI and help you achieve or maintain a healthy weight. Get regular exercise Get regular exercise. This is one of the most important things you can do for your health. Most adults should: Exercise for at least 150 minutes each week. The exercise should increase your heart rate and make you sweat (moderate-intensity exercise). Do strengthening exercises at least twice a week. This is in addition to the moderate-intensity exercise. Spend less time sitting. Even light physical activity can be beneficial. Watch cholesterol and blood lipids Have your blood tested for lipids and cholesterol at 47 years of age, then have this test every 5 years. You may need to have your cholesterol levels checked more often if: Your lipid or cholesterol levels are high. You are older than 47 years of age. You are at high risk for heart disease. What should I know about cancer screening? Many types of cancers can be detected early and may often be prevented. Depending on your health history and family history, you may need to have cancer screening at various ages. This may include screening for: Colorectal cancer. Prostate cancer. Skin cancer. Lung  cancer. What should I know about heart disease, diabetes, and high blood pressure? Blood pressure and heart disease High blood pressure causes heart disease and increases the risk of stroke. This is more likely to develop in people who have high blood pressure readings or are overweight. Talk with your health care provider about your target blood pressure readings. Have your blood pressure checked: Every 3-5 years if you are 18-39 years of age. Every year if you are 40 years old or older. If you are between the ages of 65 and 75 and are a current or former smoker, ask your health care provider if you should have a one-time screening for abdominal aortic aneurysm (AAA). Diabetes Have regular diabetes screenings. This checks your fasting blood sugar level. Have the screening done: Once every three years after age 45 if you are at a normal weight and have a low risk for diabetes. More often and at a younger age if you are overweight or have a high risk for diabetes. What should I know about preventing infection? Hepatitis B If you have a higher risk for hepatitis B, you should be screened for this virus. Talk with your health care provider to find out if you are at risk for hepatitis B infection. Hepatitis C Blood testing is recommended for: Everyone born from 1945 through 1965. Anyone with known risk factors for hepatitis C. Sexually transmitted infections (STIs) You should be screened each year for STIs, including gonorrhea and chlamydia, if: You are sexually active and are younger than 47 years of age. You are older than 47 years of age and your   health care provider tells you that you are at risk for this type of infection. Your sexual activity has changed since you were last screened, and you are at increased risk for chlamydia or gonorrhea. Ask your health care provider if you are at risk. Ask your health care provider about whether you are at high risk for HIV. Your health care provider  may recommend a prescription medicine to help prevent HIV infection. If you choose to take medicine to prevent HIV, you should first get tested for HIV. You should then be tested every 3 months for as St[place] as you are taking the medicine. Follow these instructions at home: Alcohol use Do not drink alcohol if your health care provider tells you not to drink. If you drink alcohol: Limit how much you have to 0-2 drinks a day. Know how much alcohol is in your drink. In the [place] one drink equals one 12 oz bottle of beer (355 mL), one 5 oz glass of wine (148 mL), or one 1 oz glass of hard liquor (44 mL). Lifestyle Do not use any products that contain nicotine or tobacco. These products include cigarettes, chewing tobacco, and vaping devices, such as e-cigarettes. If you need help quitting, ask your health care provider. Do not use street drugs. Do not share needles. Ask your health care provider for help if you need support or information about quitting drugs. General instructions Schedule regular health, dental, and eye exams. Stay current with your vaccines. Tell your health care provider if: You often feel depressed. You have ever been abused or do not feel safe at home. Summary Adopting a healthy lifestyle and getting preventive care are important in promoting health and wellness. Follow your health care provider's instructions about healthy diet, exercising, and getting tested or screened for diseases. Follow your health care provider's instructions on monitoring your cholesterol and blood pressure. This information is not intended to replace advice given to you by your health care provider. Make sure you discuss any questions you have with your health care provider. Document Revised: 12/16/2020 Document Reviewed: 12/16/2020 Elsevier Patient Education  2023 Elsevier Inc.  

## 2022-11-09 ENCOUNTER — Ambulatory Visit (INDEPENDENT_AMBULATORY_CARE_PROVIDER_SITE_OTHER): Payer: 59 | Admitting: Family Medicine

## 2022-11-09 ENCOUNTER — Encounter: Payer: Self-pay | Admitting: Family Medicine

## 2022-11-09 VITALS — BP 130/80 | HR 92 | Ht 69.0 in | Wt 178.2 lb

## 2022-11-09 DIAGNOSIS — Z23 Encounter for immunization: Secondary | ICD-10-CM | POA: Diagnosis not present

## 2022-11-09 DIAGNOSIS — Z0001 Encounter for general adult medical examination with abnormal findings: Secondary | ICD-10-CM | POA: Diagnosis not present

## 2022-11-09 DIAGNOSIS — R030 Elevated blood-pressure reading, without diagnosis of hypertension: Secondary | ICD-10-CM

## 2022-11-09 DIAGNOSIS — Z0000 Encounter for general adult medical examination without abnormal findings: Secondary | ICD-10-CM

## 2022-11-09 NOTE — Progress Notes (Signed)
Office Note 11/09/2022  CC:  Chief Complaint  Patient presents with   Follow-up    New pt get reestablished. No other concerns.    HPI:  Keith Jensen is a 47 y.o.  male who is here to reestablish care and get annual health maintenance exam. I last saw him back in 2018. Old records were reviewed prior to or during today's visit.  No concerns.  Feels well.  Says blood pressure is usually up in a medical setting.  He says his wife is a Marine scientist and checks his blood pressure at home periodically and it is always less than 130/80. No headaches, no dizziness, no visual abnormalities. He does run 5 miles a day!  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  [facility] notes state that since 2001 he had multiple episodes of proctitis, proctoscopy done 2005 but biopsies not performed, then pt referred to GI and diagnostic colonoscopy was recommended but pt failed to schedule this b/c his bleeding resolved (most recent GI visit as per their old notes is 01/18/2012, with Dr. Wynetta Emery)   Ulcerative colitis    Indeterminate quantiferon gold TB test, CXR normal.  Cleared for humira 06/2014.  Stable on Humira 2019.  Surveillance colonoscopy 03/2018-->mild colitis, path +chronic active colitis.    Past Surgical History:  Procedure Laterality Date   COLONOSCOPY N/A 06/22/2014   Procedure: COLONOSCOPY;  Surgeon: Milus Banister, MD;  Location: [facility];  Service: Endoscopy;  Laterality: N/A;   COLONOSCOPY  03/2018   Mild colitis on endoscopy, path+ chronic active colitis.   COLONOSCOPY W/ BIOPSIES     03/12/22.  NO adenomas.  Neg biopsies.  Rpt 3 yrs    Family History  Problem Relation Age of Onset   Heart attack Mother        per pt she had accidental overdose   Colon polyps Father     Colon cancer Neg Hx    Esophageal cancer Neg Hx    Rectal cancer Neg Hx    Stomach cancer Neg Hx     Social History   Socioeconomic History   Marital status: Married    Spouse name: Not on file   Number of children: 0   Years of education: Not on file   Highest education level: Not on file  Occupational History   Occupation: finance  Tobacco Use   Smoking status: Never   Smokeless tobacco: Never  Vaping Use   Vaping Use: Never used  Substance and Sexual Activity   Alcohol use: Yes    Alcohol/week: 1.0 standard drink of alcohol    Types: 1 Cans of beer per week    Comment: 2-3 times weekly   Drug use: No   Sexual activity: Not on file  Other Topics Concern   Not on file  Social History Narrative   Married, no children.  Father healthy, sister healthy.  Mom d. Accidental overdose.   Education: Scientist, product/process development.   Occupation: Engineer, mining.   No tobacco.  Beer 2-3 times per week.  No drugs.   Exercises: lifting wt's and biking.   Social Determinants of Health   Financial Resource Strain: Not on file  Food Insecurity: Not on file  Transportation Needs: Not on file  Physical Activity:  Not on file  Stress: Not on file  Social Connections: Not on file  Intimate Partner Violence: Not on file    Outpatient Encounter Medications as of 11/09/2022  Medication Sig   HUMIRA, 2 PEN, 40 MG/0.8ML PNKT INJECT 40MG  SUBCUTANEOUSLY EVERY 2 WEEKS   budesonide (ENTOCORT EC) 3 MG 24 hr capsule Take 1 capsule (3 mg total) by mouth daily. (Patient not taking: Reported on 11/09/2022)   [DISCONTINUED] amoxicillin-clavulanate (AUGMENTIN) 875-125 MG tablet Take 1 tablet by mouth 2 (two) times daily.   [DISCONTINUED] oseltamivir (TAMIFLU) 75 MG capsule Take 1 capsule (75 mg total) by mouth 2 (two) times daily.   No facility-administered encounter medications on file as of 11/09/2022.    No Known Allergies  Review of Systems  Constitutional:  Negative for appetite change, chills, fatigue  and fever.  HENT:  Negative for congestion, dental problem, ear pain and sore throat.   Eyes:  Negative for discharge, redness and visual disturbance.  Respiratory:  Negative for cough, chest tightness, shortness of breath and wheezing.   Cardiovascular:  Negative for chest pain, palpitations and leg swelling.  Gastrointestinal:  Negative for abdominal pain, blood in stool, diarrhea, nausea and vomiting.  Genitourinary:  Negative for difficulty urinating, dysuria, flank pain, frequency, hematuria and urgency.  Musculoskeletal:  Negative for arthralgias, back pain, joint swelling, myalgias and neck stiffness.  Skin:  Negative for pallor and rash.  Neurological:  Negative for dizziness, speech difficulty, weakness and headaches.  Hematological:  Negative for adenopathy. Does not bruise/bleed easily.  Psychiatric/Behavioral:  Negative for confusion and sleep disturbance. The patient is not nervous/anxious.     PE; Blood pressure (!) 154/101, pulse 92, height 5\' 9"  (1.753 m), weight 178 lb 3.2 oz (80.8 kg), SpO2 99 %. Body mass index is 26.32 kg/m.  Physical Exam  Gen: Alert, well appearing.  Patient is oriented to person, place, time, and situation. AFFECT: pleasant, lucid thought and speech. ENT: Ears: EACs clear, normal epithelium.  TMs with good light reflex and landmarks bilaterally.  Eyes: no injection, icteris, swelling, or exudate.  EOMI, PERRLA. Nose: no drainage or turbinate edema/swelling.  No injection or focal lesion.  Mouth: lips without lesion/swelling.  Oral mucosa pink and moist.  Dentition intact and without obvious caries or gingival swelling.  Oropharynx without erythema, exudate, or swelling.  Neck: supple/nontender.  No LAD, mass, or TM.  Carotid pulses 2+ bilaterally, without bruits. CV: RRR, no m/r/g.   LUNGS: CTA bilat, nonlabored resps, good aeration in all lung fields. ABD: soft, NT, ND, BS normal.  No hepatospenomegaly or mass.  No bruits. EXT: no clubbing,  cyanosis, or edema.  Musculoskeletal: no joint swelling, erythema, warmth, or tenderness.  ROM of all joints intact. Skin - no sores or suspicious lesions or rashes or color changes  Pertinent labs:  Last CBC Lab Results  Component Value Date   WBC 5.2 01/14/2022   HGB 15.4 01/14/2022   HCT 46.3 01/14/2022   MCV 93.7 01/14/2022   MCH 26.3 05/21/2015   RDW 13.9 01/14/2022   PLT 207.0 123456   Last metabolic panel Lab Results  Component Value Date   GLUCOSE 84 01/14/2022   NA 137 01/14/2022   K 3.9 01/14/2022   CL 101 01/14/2022   CO2 29 01/14/2022   BUN 12 01/14/2022   CREATININE 1.22 01/14/2022   GFRNONAA >60 05/21/2015   CALCIUM 9.2 01/14/2022   PHOS 3.2 06/21/2014   PROT 7.5 01/14/2022   ALBUMIN 4.0 01/14/2022   BILITOT  0.5 01/14/2022   ALKPHOS 46 01/14/2022   AST 15 01/14/2022   ALT 12 01/14/2022   ANIONGAP 8 05/21/2015   Last thyroid functions Lab Results  Component Value Date   TSH 1.16 04/29/2020   ASSESSMENT AND PLAN:   New patient, reestablishing care.  Health maintenance exam: Reviewed age and gender appropriate health maintenance issues (prudent diet, regular exercise, health risks of tobacco and excessive alcohol, use of seatbelts, fire alarms in home, use of sunscreen).  Also reviewed age and gender appropriate health screening as well as vaccine recommendations. Vaccines: Tdap-->given today. Labs: HP labs ordered Prostate ca screening: average risk patient= as per latest guidelines, start screening at 60 yrs of age. Colon ca screening: high risk for colon ca, last colonoscopy w/out adenoma 03/12/22, GI recommended rpt 3 yrs.  #2 elevated blood pressure without diagnosis of hypertension. Suspect whitecoat syndrome. Continue to monitor blood pressure at home.  Reviewed goal blood pressure 130/80 or less.  #3 ulcerative colitis, quiescent on Humira, pt UTD with GI monitoring.  An After Visit Summary was printed and given to the patient.  Return  in about 1 year (around 11/09/2023) for annual CPE (fasting).  Signed:  Crissie Sickles, MD           11/09/2022

## 2022-11-09 NOTE — Addendum Note (Signed)
Addended by: Marion Downer on: 11/09/2022 03:18 PM   Modules accepted: Orders

## 2022-11-10 LAB — CBC WITH DIFFERENTIAL/PLATELET
Basophils Absolute: 0.1 10*3/uL (ref 0.0–0.1)
Basophils Relative: 0.7 % (ref 0.0–3.0)
Eosinophils Absolute: 0.1 10*3/uL (ref 0.0–0.7)
Eosinophils Relative: 1 % (ref 0.0–5.0)
HCT: 47.7 % (ref 39.0–52.0)
Hemoglobin: 16.1 g/dL (ref 13.0–17.0)
Lymphocytes Relative: 16.1 % (ref 12.0–46.0)
Lymphs Abs: 1.9 10*3/uL (ref 0.7–4.0)
MCHC: 33.7 g/dL (ref 30.0–36.0)
MCV: 93.3 fl (ref 78.0–100.0)
Monocytes Absolute: 1.1 10*3/uL — ABNORMAL HIGH (ref 0.1–1.0)
Monocytes Relative: 9.4 % (ref 3.0–12.0)
Neutro Abs: 8.6 10*3/uL — ABNORMAL HIGH (ref 1.4–7.7)
Neutrophils Relative %: 72.8 % (ref 43.0–77.0)
Platelets: 240 10*3/uL (ref 150.0–400.0)
RBC: 5.12 Mil/uL (ref 4.22–5.81)
RDW: 13.9 % (ref 11.5–15.5)
WBC: 11.8 10*3/uL — ABNORMAL HIGH (ref 4.0–10.5)

## 2022-11-10 LAB — TSH: TSH: 2.3 u[IU]/mL (ref 0.35–5.50)

## 2022-11-11 LAB — COMPREHENSIVE METABOLIC PANEL
ALT: 17 U/L (ref 0–53)
AST: 18 U/L (ref 0–37)
Albumin: 4.5 g/dL (ref 3.5–5.2)
Alkaline Phosphatase: 44 U/L (ref 39–117)
BUN: 15 mg/dL (ref 6–23)
CO2: 29 mEq/L (ref 19–32)
Calcium: 9.6 mg/dL (ref 8.4–10.5)
Chloride: 99 mEq/L (ref 96–112)
Creatinine, Ser: 1 mg/dL (ref 0.40–1.50)
GFR: 89.92 mL/min (ref >60.00)
Glucose, Bld: 91 mg/dL (ref 70–99)
Potassium: 4 mEq/L (ref 3.5–5.1)
Sodium: 136 mEq/L (ref 135–145)
Total Bilirubin: 0.7 mg/dL (ref 0.2–1.2)
Total Protein: 7.6 g/dL (ref 6.0–8.3)

## 2022-11-11 LAB — LIPID PANEL
Cholesterol: 200 mg/dL (ref 0–200)
HDL: 71 mg/dL (ref >39.00)
LDL Cholesterol: 114 mg/dL — ABNORMAL HIGH (ref 0–99)
NonHDL: 129.12
Total CHOL/HDL Ratio: 3
Triglycerides: 76 mg/dL (ref 0.0–149.0)
VLDL: 15.2 mg/dL (ref 0.0–40.0)

## 2022-11-13 ENCOUNTER — Other Ambulatory Visit: Payer: Self-pay | Admitting: Gastroenterology

## 2022-11-16 ENCOUNTER — Other Ambulatory Visit: Payer: Self-pay | Admitting: Family Medicine

## 2022-11-16 DIAGNOSIS — D72829 Elevated white blood cell count, unspecified: Secondary | ICD-10-CM

## 2022-11-16 DIAGNOSIS — D751 Secondary polycythemia: Secondary | ICD-10-CM

## 2022-11-25 ENCOUNTER — Encounter: Payer: Self-pay | Admitting: Gastroenterology

## 2022-11-25 ENCOUNTER — Other Ambulatory Visit: Payer: Self-pay | Admitting: Gastroenterology

## 2022-11-25 MED ORDER — HUMIRA (2 PEN) 40 MG/0.8ML ~~LOC~~ AJKT: AUTO-INJECTOR | SUBCUTANEOUS | 0 refills | Status: DC

## 2022-12-26 ENCOUNTER — Telehealth: Payer: Self-pay | Admitting: Pharmacy Technician

## 2022-12-26 ENCOUNTER — Other Ambulatory Visit (HOSPITAL_COMMUNITY): Payer: Self-pay

## 2022-12-26 NOTE — Telephone Encounter (Signed)
error 

## 2023-01-07 ENCOUNTER — Other Ambulatory Visit: Payer: Self-pay | Admitting: Gastroenterology

## 2023-01-07 ENCOUNTER — Telehealth: Payer: Self-pay | Admitting: Gastroenterology

## 2023-01-07 ENCOUNTER — Encounter: Payer: Self-pay | Admitting: Gastroenterology

## 2023-01-07 MED ORDER — HUMIRA (2 PEN) 40 MG/0.8ML ~~LOC~~ AJKT: AUTO-INJECTOR | SUBCUTANEOUS | 3 refills | Status: DC

## 2023-01-07 NOTE — Telephone Encounter (Signed)
Inbound call from patient requesting a refill for "Humira". He is schedule to follow up with Dr.Stark on 8/12.Please advise

## 2023-01-07 NOTE — Addendum Note (Signed)
Addended by: Loretha Stapler on: 01/07/2023 04:23 PM   Modules accepted: Orders

## 2023-01-07 NOTE — Telephone Encounter (Signed)
See patient message from today. Prescription sent to patient's pharmacy.

## 2023-01-20 MED ORDER — GLYCOPYRROLATE 1 MG PO TABS: 1.0000 mg | ORAL_TABLET | Freq: Two times a day (BID) | ORAL | 0 refills | Status: DC

## 2023-01-20 NOTE — Telephone Encounter (Signed)
Dr Russella Dar please review and advise. The pt does have an appt set with your next available

## 2023-01-20 NOTE — Addendum Note (Signed)
Addended by: Loretha Stapler on: 01/20/2023 12:15 PM   Modules accepted: Orders

## 2023-03-22 ENCOUNTER — Encounter: Payer: Self-pay | Admitting: Gastroenterology

## 2023-03-22 ENCOUNTER — Ambulatory Visit: Payer: BC Managed Care – PPO | Admitting: Gastroenterology

## 2023-03-22 VITALS — BP 134/86 | HR 94 | Ht 70.0 in | Wt 183.0 lb

## 2023-03-22 DIAGNOSIS — K5100 Ulcerative (chronic) pancolitis without complications: Secondary | ICD-10-CM

## 2023-03-22 NOTE — Patient Instructions (Signed)
Call our office back and let me know where to send your Humira refill since your insurance has changed.   _______________________________________________________  If your blood pressure at your visit was 140/90 or greater, please contact your primary care physician to follow up on this.  _______________________________________________________  If you are age 47 or older, your body mass index should be between 23-30. Your Body mass index is 26.26 kg/m. If this is out of the aforementioned range listed, please consider follow up with your Primary Care Provider.  If you are age 73 or younger, your body mass index should be between 19-25. Your Body mass index is 26.26 kg/m. If this is out of the aformentioned range listed, please consider follow up with your Primary Care Provider.   ________________________________________________________  The [facility] providers would like to encourage you to use [facility] to communicate with providers for non-urgent requests or questions.  Due to [facility] hold times on the telephone, sending your provider a message by [facility] may be a faster and more efficient way to get a response.  Please allow 48 business hours for a response.  Please remember that this is for non-urgent requests.  _______________________________________________________

## 2023-03-22 NOTE — Progress Notes (Signed)
    Assessment     Ulcerative pancolitis maintained on Humira 40 mg q14 days   Recommendations    Continue Humira 40 mg SQ q14 days Surveillance colonoscopy recommended in August 2026 REV in 1 year   HPI    This is a 47 year old male ulcerative pancolitis maintained on Humira. He is doing well. Colonoscopy in Aug 2023 did not show inflammation.  He has no active gastrointestinal complaints and states he is doing very well.  Blood work from April 2024 reviewed.   Labs / Imaging       Latest Ref Rng & Units 11/09/2022    3:15 PM 01/14/2022    3:29 PM 04/29/2020    3:38 PM  Hepatic Function  Total Protein 6.0 - 8.3 g/dL 7.6  7.5  8.4   Albumin 3.5 - 5.2 g/dL 4.5  4.0  4.6   AST 0 - 37 U/L 18  15  13    ALT 0 - 53 U/L 17  12  19    Alk Phosphatase 39 - 117 U/L 44  46  55   Total Bilirubin 0.2 - 1.2 mg/dL 0.7  0.5  0.5        Latest Ref Rng & Units 11/09/2022    3:15 PM 01/14/2022    3:29 PM 04/29/2020    3:38 PM  CBC  WBC 4.0 - 10.5 K/uL 11.8  5.2  7.0   Hemoglobin 13.0 - 17.0 g/dL 86.5  78.4  69.6   Hematocrit 39.0 - 52.0 % 47.7  46.3  47.0   Platelets 150.0 - 400.0 K/uL 240.0  207.0  220.0    Current Medications, Allergies, Past Medical History, Past Surgical History, Family History and Social History were reviewed in [employer] record.   Physical Exam: General: Well developed, well nourished, no acute distress Head: Normocephalic and atraumatic Eyes: Sclerae anicteric, EOMI Ears: Normal auditory acuity Mouth: No deformities or lesions noted Lungs: Clear throughout to auscultation Heart: Regular rate and rhythm; No murmurs, rubs or bruits Abdomen: Soft, non tender and non distended. No masses, hepatosplenomegaly or hernias noted. Normal Bowel sounds Rectal: Not done  Musculoskeletal: Symmetrical with no gross deformities  Pulses:  Normal pulses noted Extremities: No edema or deformities noted Neurological: Alert oriented x 4, grossly  nonfocal Psychological:  Alert and cooperative. Normal mood and affect   Tamaira Ciriello T. Russella Dar, MD 03/22/2023, 2:57 PM

## 2023-03-23 ENCOUNTER — Encounter: Payer: Self-pay | Admitting: Gastroenterology

## 2023-03-23 ENCOUNTER — Telehealth: Payer: Self-pay | Admitting: Gastroenterology

## 2023-03-23 NOTE — Telephone Encounter (Signed)
Inbound call from [facility] regarding Humira for patient. Pharmacy wanted to provide Key Code [id number]. States prior auth department number is [phone].

## 2023-03-25 NOTE — Telephone Encounter (Signed)
The pt is needing a f/u on his PA.  See note thank you

## 2023-03-29 NOTE — Telephone Encounter (Signed)
Inbound call from [facility] regarding Humira for patient. Stating there is information missed in order to authorize medication. Please advise, thank you.

## 2023-03-31 NOTE — Telephone Encounter (Signed)
[facility] called regarding an issue with a prior auth. Also need the ICD code. Requesting a call back at   [phone]

## 2023-04-02 ENCOUNTER — Other Ambulatory Visit: Payer: Self-pay

## 2023-04-02 ENCOUNTER — Other Ambulatory Visit (HOSPITAL_COMMUNITY): Payer: Self-pay

## 2023-04-02 MED ORDER — HUMIRA (2 PEN) 40 MG/0.8ML ~~LOC~~ AJKT: AUTO-INJECTOR | SUBCUTANEOUS | 3 refills | Status: DC

## 2023-04-02 NOTE — Telephone Encounter (Signed)
Inbound call from [facility] regarding Humira for patient wanting to follow up on prior auth. Stated that they will be putting the order on  hold until it gets approved.  Requesting a  call back at [phone]

## 2023-04-05 ENCOUNTER — Other Ambulatory Visit (HOSPITAL_COMMUNITY): Payer: Self-pay

## 2023-04-05 ENCOUNTER — Telehealth: Payer: Self-pay

## 2023-04-05 ENCOUNTER — Encounter: Payer: Self-pay | Admitting: Gastroenterology

## 2023-04-05 NOTE — Telephone Encounter (Signed)
The pt has been advised of the approval for Humira

## 2023-04-05 NOTE — Telephone Encounter (Signed)
Pharmacy Patient Advocate Encounter  Received notification from [facility] that Prior Authorization for Humira (2 Pen) 40MG /0.8ML pen-injector kit has been APPROVED from 04-05-2023 to 04-04-2024   PA #/Case ID/Reference #: [id number]

## 2023-06-23 ENCOUNTER — Telehealth: Payer: BC Managed Care – PPO | Admitting: Family Medicine

## 2023-06-23 DIAGNOSIS — K51911 Ulcerative colitis, unspecified with rectal bleeding: Secondary | ICD-10-CM | POA: Diagnosis not present

## 2023-06-23 MED ORDER — PREDNISONE 10 MG (21) PO TBPK
ORAL_TABLET | ORAL | 0 refills | Status: DC
Start: 2023-06-23 — End: 2023-08-18

## 2023-06-23 NOTE — Patient Instructions (Addendum)
  Keith Jensen, thank you for joining Freddy Finner, NP for today's virtual visit.  While this provider is not your primary care provider (PCP), if your PCP is located in our provider database this encounter information will be shared with them immediately following your visit.   A [facility] MyChart account gives you access to today's visit and all your visits, tests, and labs performed at [facility] " click here if you don't have a [facility] MyChart account or go to [email or web address]  Consent: (Patient) Keith Jensen provided verbal consent for this virtual visit at the beginning of the encounter.  Current Medications:  Current Outpatient Medications:    predniSONE (STERAPRED UNI-PAK 21 TAB) 10 MG (21) TBPK tablet, Take as directed, Disp: 21 tablet, Rfl: 0   adalimumab (HUMIRA, 2 PEN,) 40 MG/0.8ML PNKT pen, INJECT 40MG  SUBCUTANEOUSLY EVERY 2 WEEKS Strength: 40 MG/0.8ML-, Disp: 2 each, Rfl: 3   budesonide (ENTOCORT EC) 3 MG 24 hr capsule, Take 1 capsule (3 mg total) by mouth daily. (Patient not taking: Reported on 11/09/2022), Disp: 42 capsule, Rfl: 0   glycopyrrolate (ROBINUL) 1 MG tablet, Take 1 tablet (1 mg total) by mouth 2 (two) times daily. (Patient not taking: Reported on 03/22/2023), Disp: 60 tablet, Rfl: 0   Medications ordered in this encounter:  Meds ordered this encounter  Medications   predniSONE (STERAPRED UNI-PAK 21 TAB) 10 MG (21) TBPK tablet    Sig: Take as directed    Dispense:  21 tablet    Refill:  0    Order Specific Question:   Supervising Provider    Answer:   Merrilee Jansky [id number]     *If you need refills on other medications prior to your next appointment, please contact your pharmacy*  Follow-Up: Call back or seek an in-person evaluation if the symptoms worsen or if the condition fails to improve as anticipated.  [facility] [phone]  Other Instructions   -will start oral pred with close follow up with GI to  make sure all is ok and no med adjustments are needed -hydrate   If you have been instructed to have an in-person evaluation today at a local Urgent Care facility, please use the link below. It will take you to a list of all of our available [facility], including address, phone number and hours of operation. Please do not delay care.  [facility]  If you or a family member do not have a primary care provider, use the link below to schedule a visit and establish care. When you choose a [facility] primary care physician or advanced practice provider, you gain a [place]-term partner in health. Find a Primary Care Provider  Learn more about [facility]'s in-office and virtual care options: [facility] - Get Care Now

## 2023-06-23 NOTE — Progress Notes (Signed)
Virtual Visit Consent   [name], you are scheduled for a virtual visit with a  provider today. Just as with appointments in the office, your consent must be obtained to participate. Your consent will be active for this visit and any virtual visit you may have with one of our providers in the next 365 days. If you have a MyChart account, a copy of this consent can be sent to you electronically.  As this is a virtual visit, video technology does not allow for your provider to perform a traditional examination. This may limit your provider's ability to fully assess your condition. If your provider identifies any concerns that need to be evaluated in person or the need to arrange testing (such as labs, EKG, etc.), we will make arrangements to do so. Although advances in technology are sophisticated, we cannot ensure that it will always work on either your end or our end. If the connection with a video visit is poor, the visit may have to be switched to a telephone visit. With either a video or telephone visit, we are not always able to ensure that we have a secure connection.  By engaging in this virtual visit, you consent to the provision of healthcare and authorize for your insurance to be billed (if applicable) for the services provided during this visit. Depending on your insurance coverage, you may receive a charge related to this service.  I need to obtain your verbal consent now. Are you willing to proceed with your visit today? [name] has provided verbal consent on 06/23/2023 for a virtual visit (video or telephone). Freddy Finner, NP  Date: 06/23/2023 11:18 AM  Virtual Visit via Video Note   I, Freddy Finner, connected with  Keith Jensen  ([id number], Aug 05, 1976) on 06/23/23 at 11:15 AM EST by a video-enabled telemedicine application and verified that I am speaking with the correct person using two identifiers.  Location: Patient: Virtual Visit Location Patient:  Home Provider: Virtual Visit Location Provider: Home Office   I discussed the limitations of evaluation and management by telemedicine and the availability of in person appointments. The patient expressed understanding and agreed to proceed.    History of Present Illness: Keith Jensen is a 47 y.o. who identifies as a male who was assigned male at birth, and is being seen today for UC flare  Stomach upset sensation- not having cramps or pain. Bleeding in stool- a few times a week just recently started. No weight loss, nausea, vomiting, dizziness, chest pain or shortness of breath.  Has used oral prednisone in past - usually like once a year if that for these types of small flares. Overall well controlled.   Colonoscopy- end of last year and saw his GI in May of this year.    Problems:  Patient Active Problem List   Diagnosis Date Noted   Submandibular gland infection 05/21/2015   Encounter for health-related screening    Hypokalemia 06/21/2014   Ulcerative colitis (HCC) 05/22/2014    Allergies: No Known Allergies Medications:  Current Outpatient Medications:    adalimumab (HUMIRA, 2 PEN,) 40 MG/0.8ML PNKT pen, INJECT 40MG  SUBCUTANEOUSLY EVERY 2 WEEKS Strength: 40 MG/0.8ML-, Disp: 2 each, Rfl: 3   budesonide (ENTOCORT EC) 3 MG 24 hr capsule, Take 1 capsule (3 mg total) by mouth daily. (Patient not taking: Reported on 11/09/2022), Disp: 42 capsule, Rfl: 0   glycopyrrolate (ROBINUL) 1 MG tablet, Take 1 tablet (1 mg total) by mouth  2 (two) times daily. (Patient not taking: Reported on 03/22/2023), Disp: 60 tablet, Rfl: 0  Observations/Objective: Patient is well-developed, well-nourished in no acute distress.  Resting comfortably  at home.  Head is normocephalic, atraumatic.  No labored breathing.  Speech is clear and coherent with logical content.  Patient is alert and oriented at baseline.    Assessment and Plan:  1. Ulcerative colitis with rectal bleeding, unspecified location  (HCC)  - predniSONE (STERAPRED UNI-PAK 21 TAB) 10 MG (21) TBPK tablet; Take as directed  Dispense: 21 tablet; Refill: 0  -will start oral pred with close follow up with GI to make sure all is ok and no med adjustments are needed -hydrate -in person ASAP precautions reviewed   Reviewed side effects, risks and benefits of medication.    Patient acknowledged agreement and understanding of the plan.   Past Medical, Surgical, Social History, Allergies, and Medications have been Reviewed.     Follow Up Instructions: I discussed the assessment and treatment plan with the patient. The patient was provided an opportunity to ask questions and all were answered. The patient agreed with the plan and demonstrated an understanding of the instructions.  A copy of instructions were sent to the patient via MyChart unless otherwise noted below.     The patient was advised to call back or seek an in-person evaluation if the symptoms worsen or if the condition fails to improve as anticipated.    Freddy Finner, NP

## 2023-07-01 ENCOUNTER — Other Ambulatory Visit: Payer: Self-pay | Admitting: Gastroenterology

## 2023-07-12 ENCOUNTER — Telehealth: Payer: Self-pay

## 2023-07-12 NOTE — Telephone Encounter (Signed)
-----   Message -----  From: Meryl Dare, MD  Sent: 06/23/2023  12:30 PM EST  To: Illene Bolus, CMA   Please place him on a list to schedule an office appt with Dr. Doy Hutching in January. Thanks.  ----- Message -----  From: Freddy Finner, NP  Sent: 06/23/2023  11:27 AM EST  To: Meryl Dare, MD; Jeoffrey Massed, MD

## 2023-07-12 NOTE — Telephone Encounter (Signed)
Left message for patient to return my call.

## 2023-07-13 NOTE — Telephone Encounter (Signed)
Returned patient's call and scheduled him to see Dr. Doy Hutching on 09/03/23 at 3:20pm.

## 2023-07-13 NOTE — Telephone Encounter (Signed)
Patient returning call. Please advise

## 2023-08-16 ENCOUNTER — Encounter: Payer: Self-pay | Admitting: Family Medicine

## 2023-08-18 ENCOUNTER — Ambulatory Visit: Payer: BC Managed Care – PPO | Admitting: Family Medicine

## 2023-08-18 ENCOUNTER — Encounter: Payer: Self-pay | Admitting: Family Medicine

## 2023-08-18 VITALS — BP 133/89 | HR 77 | Wt 178.5 lb

## 2023-08-18 DIAGNOSIS — R0789 Other chest pain: Secondary | ICD-10-CM

## 2023-08-18 DIAGNOSIS — Z23 Encounter for immunization: Secondary | ICD-10-CM | POA: Diagnosis not present

## 2023-08-18 DIAGNOSIS — D72829 Elevated white blood cell count, unspecified: Secondary | ICD-10-CM | POA: Diagnosis not present

## 2023-08-18 NOTE — Progress Notes (Signed)
 OFFICE VISIT  08/18/2023  CC:  Chief Complaint  Patient presents with   Flank Pain    R side for a couple weeks. Mild, tender pain. Sore to the touch. Pt has not taken anything for pain.     Patient is a 48 y.o. male who presents for pain in the right anterior rib region.  HPI: Onset of pain in the right anterior rib region a couple weeks ago around the time he got a significant cold.  Occasionally the pain will shoot back around his right side to the mid back but this is not very often and is only brief.  No rash. He has had some coughing but nothing very much. He does not recall any preceding strain or overuse.  He does lift weights daily and has continued to be able to do this without problem.  In fact, today he did bench press. No shortness of breath, no nausea, no abdominal pain, no fever.  He had UC flare a couple of months ago and was rx'd a steroid taper.  Symptoms improved/resolved.  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  [facility] notes state that since 2001 he had multiple episodes of proctitis, proctoscopy done 2005 but biopsies not performed, then pt referred to GI and diagnostic colonoscopy was recommended but pt failed to schedule this b/c his bleeding resolved (most recent GI visit as per their old notes is 01/18/2012, with Dr. Vicci)   Ulcerative colitis [facility])    Indeterminate quantiferon gold TB test, CXR normal.  Cleared for humira  06/2014.  Stable on Humira  2019.  Surveillance colonoscopy 03/2018-->mild colitis, path +chronic active colitis.    Past Surgical History:  Procedure Laterality Date   COLONOSCOPY N/A 06/22/2014   Procedure: COLONOSCOPY;  Surgeon: Toribio SHAUNNA Cedar, MD;  Location: [facility];  Service: Endoscopy;  Laterality: N/A;    COLONOSCOPY  03/2018   Mild colitis on endoscopy, path+ chronic active colitis.   COLONOSCOPY W/ BIOPSIES     03/12/22.  NO adenomas.  Neg biopsies.  Rpt 3 yrs    Outpatient Medications Prior to Visit  Medication Sig Dispense Refill   adalimumab  (HUMIRA , 2 PEN,) 40 MG/0.8ML AJKT pen INJECT 1 PEN UNDER THE SKIN EVERY 14 DAYS 2 each 3   budesonide  (ENTOCORT EC ) 3 MG 24 hr capsule Take 1 capsule (3 mg total) by mouth daily. (Patient not taking: Reported on 11/09/2022) 42 capsule 0   glycopyrrolate  (ROBINUL ) 1 MG tablet Take 1 tablet (1 mg total) by mouth 2 (two) times daily. (Patient not taking: Reported on 03/22/2023) 60 tablet 0   predniSONE  (STERAPRED UNI-PAK 21 TAB) 10 MG (21) TBPK tablet Take as directed 21 tablet 0   No facility-administered medications prior to visit.    No Known Allergies  Review of Systems  As per HPI  PE:    08/18/2023    3:56 PM 03/22/2023    2:29 PM 11/09/2022    3:18 PM  Vitals with BMI  Height  5' 10   Weight 178 lbs 8 oz 183 lbs   BMI  26.26   Systolic 133 134 869  Diastolic 89 86 80  Pulse 77 94      Physical Exam  Gen: Alert, well appearing.  Patient is  oriented to person, place, time, and situation. AFFECT: pleasant, lucid thought and speech. CV: RRR, no m/r/g.   LUNGS: CTA bilat, nonlabored resps, good aeration in all lung fields. ABD: soft, NT, ND, BS normal.  No hepatospenomegaly or mass.  No bruits. Chest wall: No tenderness to palpation.  Palpation of the lower anterior rib region does cause some pain to come briefly in the right posterior rib region.  LABS:  Last CBC Lab Results  Component Value Date   WBC 11.8 (H) 11/09/2022   HGB 16.1 11/09/2022   HCT 47.7 11/09/2022   MCV 93.3 11/09/2022   MCH 26.3 05/21/2015   RDW 13.9 11/09/2022   PLT 240.0 11/09/2022   Last metabolic panel Lab Results  Component Value Date   GLUCOSE 91 11/09/2022   NA 136 11/09/2022   K 4.0 11/09/2022   CL 99 11/09/2022   CO2 29 11/09/2022   BUN 15  11/09/2022   CREATININE 1.00 11/09/2022   GFR 89.92 11/09/2022   CALCIUM 9.6 11/09/2022   PHOS 3.2 06/21/2014   PROT 7.6 11/09/2022   ALBUMIN 4.5 11/09/2022   BILITOT 0.7 11/09/2022   ALKPHOS 44 11/09/2022   AST 18 11/09/2022   ALT 17 11/09/2022   ANIONGAP 8 05/21/2015   IMPRESSION AND PLAN:  Right anterior chest wall pain. Unknown etiology but suspect benign. Reassured.  Of note, on his routine annual exam labs back in April 2024 his white blood cell count was slightly elevated at 11,800, slightly increased neutrophils. Plan was to have him return in 4 to 6 weeks for recheck. Will go ahead and do this recheck today to make sure not trending upward.  An After Visit Summary was printed and given to the patient.  FOLLOW UP: Return if symptoms worsen or fail to improve.  Signed:  Gerlene Hockey, MD           08/18/2023

## 2023-08-19 LAB — CBC WITH DIFFERENTIAL/PLATELET
Basophils Absolute: 0 10*3/uL (ref 0.0–0.1)
Basophils Relative: 0.3 % (ref 0.0–3.0)
Eosinophils Absolute: 0.1 10*3/uL (ref 0.0–0.7)
Eosinophils Relative: 2 % (ref 0.0–5.0)
HCT: 52.5 % — ABNORMAL HIGH (ref 39.0–52.0)
Hemoglobin: 17.1 g/dL — ABNORMAL HIGH (ref 13.0–17.0)
Lymphocytes Relative: 34.7 % (ref 12.0–46.0)
Lymphs Abs: 2.2 10*3/uL (ref 0.7–4.0)
MCHC: 32.6 g/dL (ref 30.0–36.0)
MCV: 95.1 fL (ref 78.0–100.0)
Monocytes Absolute: 0.7 10*3/uL (ref 0.1–1.0)
Monocytes Relative: 11.3 % (ref 3.0–12.0)
Neutro Abs: 3.2 10*3/uL (ref 1.4–7.7)
Neutrophils Relative %: 51.7 % (ref 43.0–77.0)
Platelets: 213 10*3/uL (ref 150.0–400.0)
RBC: 5.53 Mil/uL (ref 4.22–5.81)
RDW: 14 % (ref 11.5–15.5)
WBC: 6.3 10*3/uL (ref 4.0–10.5)

## 2023-09-03 ENCOUNTER — Ambulatory Visit: Payer: BC Managed Care – PPO | Admitting: Pediatrics

## 2023-10-21 ENCOUNTER — Other Ambulatory Visit: Payer: Self-pay

## 2023-10-21 ENCOUNTER — Telehealth: Payer: Self-pay | Admitting: Pediatrics

## 2023-10-21 MED ORDER — HUMIRA (2 PEN) 40 MG/0.8ML ~~LOC~~ AJKT: AUTO-INJECTOR | SUBCUTANEOUS | 1 refills | Status: DC

## 2023-10-21 NOTE — Telephone Encounter (Signed)
 Inbound call from patient stating that he is in need of a refill for Humira. Patient has appointment with Dr. Doy Hutching on 4/1 at 8:30. Please advise.

## 2023-10-21 NOTE — Telephone Encounter (Signed)
 Pt made aware that refills were sent in & to keep OV as scheduled.

## 2023-11-08 NOTE — Progress Notes (Unsigned)
 [facility] Return Visit   Referring Provider Jeoffrey Massed, MD [address] [place],  [place] [address]  Primary Care Provider McGowen, Maryjean Morn, MD  Patient Profile: Keith Jensen is a 48 y.o. male with a past medical history noteworthy for elevated blood pressure, hypercholesterolemia who returns to the [facility] for follow-up of the problem(s) noted below.  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 was last seen in the GI office 03/2023 by Dr. Russella Dar   Current GI Meds  Humira 40 mg subcutaneously q. 2 weeks  Interval History   -- Reports overall doing well since last visit 03/2023  --Typically having 1 formed bowel movement a day without any blood or mucus -- No fecal urgency, tenesmus or nocturnal bowel movements -- Endorses occasional soreness in lower abdomen-unsure if it is related to stress -- No upper GI tract symptoms of GERD, nausea, vomiting, dysphagia or dyne aphasia  -- Denies extraintestinal manifestations of IBD including fevers, chills, joint pain, skin rashes or oral ulcers  -- Energy and appetite are good -- Exercises on a daily basis -- Maintaining weight at 182 pounds  -- Denies any side effects of Humira -- No significant intercurrent infections  -- Due to be evaluated by dermatologist for skin exam on immune suppressant   Last colonoscopy:  03/2022 -pseudopolypoid, scarred mucosa with diminished vascular pattern in [place], DC, TC; DC polyp removed, IH Last endoscopy: None  Last Abd CT/CTE/MRE:  2015 -mild wall thickening throughout the entire colon  GI Review of Symptoms Significant for occasional abdominal soreness. Otherwise negative.  General Review of Systems  Review of systems is significant for the pertinent  positives and negatives as listed per the HPI.  Full ROS is otherwise negative.  Inflammatory Bowel Disease History  -- Diagnosed with ulcerative colitis ~2000 -initially a form of proctitis -- Engaged in care sparingly for the first 10 to 15 years with intermittent prednisone use -- Hospital admission 06/2014 for severe UC flare treated with steroids and subsequently started Humira  40 mg Q 2 weeks 08/2014 -- Colonoscopy 2018-[facility] inflammation in DC, TC, otherwise normal -- Colonoscopy 2023-endoscopic remission   IBD Medication History Prednisone Humira-initiated 08/2014 for severe UC flare requiring hospitalization and intravenous steroids  Past Medical History   Past Medical History:  Diagnosis Date   Elevated blood pressure reading without diagnosis of hypertension    Hypercholesteremia    Influenza A 10/04/2018   [facility]   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  [facility] notes state that since 2001 he had multiple episodes of proctitis, proctoscopy done 2005 but biopsies not performed, then pt referred to GI and diagnostic colonoscopy was recommended but pt failed to schedule this b/c his bleeding resolved (most recent GI visit as per their old notes is 01/18/2012, with Dr. Laural Benes)   Ulcerative colitis (HCC)    Indeterminate quantiferon gold TB test, CXR normal.  Cleared for humira 06/2014.  Stable on Humira 2019.  Surveillance colonoscopy 03/2018-->mild colitis, path +chronic active colitis.     Past Surgical History   Past Surgical History:  Procedure Laterality Date   COLONOSCOPY N/A 06/22/2014   Procedure: COLONOSCOPY;  Surgeon: Rachael Fee, MD;  Location: [facility];  Service: Endoscopy;  Laterality:  N/A;   COLONOSCOPY  03/2018   Mild colitis on endoscopy, path+ chronic active colitis.   COLONOSCOPY W/ BIOPSIES     03/12/22.  NO adenomas.  Neg biopsies.  Rpt 3 yrs     Allergies and Medications  No Active  Allergies    Current Outpatient Medications:    adalimumab (HUMIRA, 2 PEN,) 40 MG/0.8ML AJKT pen, INJECT 1 PEN UNDER THE SKIN EVERY 14 DAYS, Disp: 2 each, Rfl: 1   Family History   Family History  Problem Relation Age of Onset   Heart attack Mother        per pt she had accidental overdose   Colon polyps Father    Colon cancer Neg Hx    Esophageal cancer Neg Hx    Rectal cancer Neg Hx    Stomach cancer Neg Hx     Social History   Social History   Tobacco Use   Smoking status: Never   Smokeless tobacco: Never  Vaping Use   Vaping status: Never Used  Substance Use Topics   Alcohol use: Yes    Alcohol/week: 1.0 standard drink of alcohol    Types: 1 Cans of beer per week    Comment: 2-3 times weekly   Drug use: No   Jadis reports that he has never smoked. He has never used smokeless tobacco. He reports current alcohol use of about 1.0 standard drink of alcohol per week. He reports that he does not use drugs.  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 Weight: 182 lb (82.6 kg)  General: Well developed, well nourished, no acute distress Head: Normocephalic and atraumatic Eyes: Sclerae anicteric, EOMI Lungs: Clear throughout to auscultation Heart: Regular rate and rhythm; No murmurs, rubs or bruits Abdomen: Soft, mild tenderness to palpation in the right lower quadrant and non distended. No masses, hepatosplenomegaly or hernias noted. Normal Bowel sounds Rectal: Deferred Musculoskeletal: Symmetrical with no gross deformities    Review of Data  The following data was reviewed at the time of this encounter:  Laboratory Studies      Latest Ref Rng & Units 11/09/2023    9:30 AM 08/18/2023    4:25 PM 11/09/2022    3:15 PM  CBC  WBC 4.0 -  10.5 K/uL 6.5  6.3  11.8   Hemoglobin 13.0 - 17.0 g/dL 29.5  62.1  30.8   Hematocrit 39.0 - 52.0 % 52.1  52.5  47.7   Platelets 150.0 - 400.0 K/uL 216.0  213.0  240.0     Lab Results  Component Value Date   LIPASE 31 06/20/2014      Latest Ref Rng & Units 11/09/2023    9:30 AM 11/09/2022    3:15 PM 01/14/2022    3:29 PM  CMP  Glucose 70 - 99 mg/dL 94  91  84   BUN 6 - 23 mg/dL 22  15  12    Creatinine 0.40 - 1.50 mg/dL 6.57  8.46  9.62   Sodium 135 - 145 mEq/L 135  136  137   Potassium 3.5 - 5.1 mEq/L 4.5  4.0  3.9   Chloride 96 - 112 mEq/L 98  99  101   CO2 19 - 32 mEq/L 28  29  29  Calcium 8.4 - 10.5 mg/dL 9.4  9.6  9.2   Total Protein 6.0 - 8.3 g/dL 8.1  7.6  7.5   Total Bilirubin 0.2 - 1.2 mg/dL 0.4  0.7  0.5   Alkaline Phos 39 - 117 U/L 38  44  46   AST 0 - 37 U/L 18  18  15    ALT 0 - 53 U/L 20  17  12      IBD Labs  Prebiologic Labs 2025 Hepatitis B surface antigen negative Hepatitis B surface antibody negative Hepatitis B core antibody negative QuantiFERON gold pending  2021 Hepatitis B surface antigen negative QuantiFERON gold negative  2015 Hepatitis B surface antibody negative  Therapeutic Drug Monitoring  Thiopurine metabolite levels:  Date:                6-TGN       6-MMP  Biologic level and antibodies:  Fecal Calprotectin   Imaging Studies  CTAP 06/2014 Mild wall thickening throughout the entire colon without significant adjacent mesenteric inflammatory or free fluid. Findings are compatible with patient's known ulcerative colitis likely representing active inflammation.  GI Procedures and Studies   Colonoscopy 03/2022 Pseudopolypoid, scarred mucosa with diminished vascular pattern in [place], DC, TC; DC polyp removed -hyperplastic, IH Path: Normal tissue throughout colon with benign lymphoid aggregates, hyperplastic polyp  Colonoscopy 03/2018 [facility] ulcerative colitis extending from DC to TC, otherwise normal, IH Path: Chronic colitis in the  rectum, left colon and transverse colon; normal tissue in right colon  Colonoscopy 06/2014 Severe inflammation from the distal rectum to the maximal extent of exam at splenic flexure.  Procedure aborted prematurely due to severity of inflammation. Path: Chronic ulcerative colitis  Clinical Impression  It is my clinical impression that Mr. Mareno is a 48 y.o. 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 Dowding was diagnosed with a form of ulcerative colitis phenotyped as ulcerative proctitis around 2000.  His records indicate intermittent engagement with care over that time with as needed prednisone use but no standard maintenance therapy.  In 2015 he incurred a severe flare requiring inpatient hospitalization and need for IV corticosteroids.  He was subsequently started on maintenance therapy with Humira 08/2014.  He has remained on Humira 40 mg subcutaneously every 2 weeks since that time.  Overall, Mr. Bottino feels that he is symptomatically doing well.  He notes intermittent lower abdominal soreness and is unsure if this is related to ulcerative colitis versus stress.  Otherwise he denies fecal urgency, tenesmus or hematochezia.  Discussed that fecal calprotectin assessment in addition to routine monitoring labs would be appropriate at this time.  I also advised updated therapeutic drug monitoring in light of his report of abdominal soreness.  His last colonoscopy in 2023 was reassuring demonstrating endoscopic remission.  Reviewed that if his fecal calprotectin were elevated we could consider a sooner colonoscopy for endoscopic restaging..   Plan  Continue Humira 40 mg subcutaneously every 2 weeks [place]-term immune suppressant monitoring labs today: CBC, CMP, ESR, CRP, hepatitis B studies, QuantiFERON gold, vitamin D, fecal calprotectin, adalimumab level and  antiadalimumab antibodies Monitor weight and anthropometrics   IBD Health Maintenance  Vaccinations Influenza: PCV13: PPSV23: COVID19: HAV/HBV: Updating labs 11/2023 - HBV negative and advised immunization Shingles: Due on IS HPV:  DEXA PRN  Eye Exam PRN  Skin Exam Dermatology referral placed today for FBSE on  IS  Surveillance Colonoscopy 2023 normal - next due 2026  Tobacco Use None  Depression Screen     Planned Follow Up 6 months  The patient or caregiver verbalized understanding of the material covered, with no barriers to understanding. All questions were answered. Patient or caregiver is agreeable with the plan outlined above.    It was a pleasure to see Jaice.  If you have any questions or concerns regarding this evaluation, do not hesitate to contact me.  Maren Beach, MD [facility]

## 2023-11-09 ENCOUNTER — Ambulatory Visit: Payer: BC Managed Care – PPO | Admitting: Pediatrics

## 2023-11-09 ENCOUNTER — Encounter: Payer: Self-pay | Admitting: Pediatrics

## 2023-11-09 ENCOUNTER — Other Ambulatory Visit (INDEPENDENT_AMBULATORY_CARE_PROVIDER_SITE_OTHER)

## 2023-11-09 VITALS — BP 142/100 | HR 84 | Ht 71.0 in | Wt 182.0 lb

## 2023-11-09 DIAGNOSIS — G8929 Other chronic pain: Secondary | ICD-10-CM

## 2023-11-09 DIAGNOSIS — Z83719 Family history of colon polyps, unspecified: Secondary | ICD-10-CM | POA: Diagnosis not present

## 2023-11-09 DIAGNOSIS — Z7960 Long term (current) use of unspecified immunomodulators and immunosuppressants: Secondary | ICD-10-CM

## 2023-11-09 DIAGNOSIS — K5100 Ulcerative (chronic) pancolitis without complications: Secondary | ICD-10-CM | POA: Diagnosis not present

## 2023-11-09 DIAGNOSIS — R109 Unspecified abdominal pain: Secondary | ICD-10-CM | POA: Diagnosis not present

## 2023-11-09 DIAGNOSIS — K5190 Ulcerative colitis, unspecified, without complications: Secondary | ICD-10-CM | POA: Diagnosis not present

## 2023-11-09 DIAGNOSIS — D751 Secondary polycythemia: Secondary | ICD-10-CM

## 2023-11-09 LAB — CBC WITH DIFFERENTIAL/PLATELET
Basophils Absolute: 0 10*3/uL (ref 0.0–0.1)
Basophils Relative: 0.3 % (ref 0.0–3.0)
Eosinophils Absolute: 0.2 10*3/uL (ref 0.0–0.7)
Eosinophils Relative: 2.4 % (ref 0.0–5.0)
HCT: 52.1 % — ABNORMAL HIGH (ref 39.0–52.0)
Hemoglobin: 17.5 g/dL — ABNORMAL HIGH (ref 13.0–17.0)
Lymphocytes Relative: 20.2 % (ref 12.0–46.0)
Lymphs Abs: 1.3 10*3/uL (ref 0.7–4.0)
MCHC: 33.7 g/dL (ref 30.0–36.0)
MCV: 93 fl (ref 78.0–100.0)
Monocytes Absolute: 0.7 10*3/uL (ref 0.1–1.0)
Monocytes Relative: 11.1 % (ref 3.0–12.0)
Neutro Abs: 4.3 10*3/uL (ref 1.4–7.7)
Neutrophils Relative %: 66 % (ref 43.0–77.0)
Platelets: 216 10*3/uL (ref 150.0–400.0)
RBC: 5.6 Mil/uL (ref 4.22–5.81)
RDW: 14.8 % (ref 11.5–15.5)
WBC: 6.5 10*3/uL (ref 4.0–10.5)

## 2023-11-09 LAB — COMPREHENSIVE METABOLIC PANEL WITH GFR
ALT: 20 U/L (ref 0–53)
AST: 18 U/L (ref 0–37)
Albumin: 4.5 g/dL (ref 3.5–5.2)
Alkaline Phosphatase: 38 U/L — ABNORMAL LOW (ref 39–117)
BUN: 22 mg/dL (ref 6–23)
CO2: 28 meq/L (ref 19–32)
Calcium: 9.4 mg/dL (ref 8.4–10.5)
Chloride: 98 meq/L (ref 96–112)
Creatinine, Ser: 1.13 mg/dL (ref 0.40–1.50)
GFR: 77.11 mL/min (ref >60.00)
Glucose, Bld: 94 mg/dL (ref 70–99)
Potassium: 4.5 meq/L (ref 3.5–5.1)
Sodium: 135 meq/L (ref 135–145)
Total Bilirubin: 0.4 mg/dL (ref 0.2–1.2)
Total Protein: 8.1 g/dL (ref 6.0–8.3)

## 2023-11-09 LAB — SEDIMENTATION RATE: Sed Rate: 12 mm/h (ref 0–15)

## 2023-11-09 LAB — VITAMIN D 25 HYDROXY (VIT D DEFICIENCY, FRACTURES): VITD: 51.22 ng/mL (ref 30.00–100.00)

## 2023-11-09 LAB — C-REACTIVE PROTEIN: CRP: <1 mg/dL (ref 0.5–20.0)

## 2023-11-09 NOTE — Patient Instructions (Addendum)
 Your provider has requested that you go to the basement level for lab work before leaving today. Press "B" on the elevator. The lab is located at the first door on the left as you exit the elevator.  Due to recent changes in healthcare laws, you may see the results of your imaging and laboratory studies on MyChart before your provider has had a chance to review them.  We understand that in some cases there may be results that are confusing or concerning to you. Not all laboratory results come back in the same time frame and the provider may be waiting for multiple results in order to interpret others.  Please give [place] 48 hours in order for your provider to thoroughly review all the results before contacting the office for clarification of your results.    We sent a referral to Dermatology for skin cancer screening.  Follow up in 6 months  Thank you for entrusting me with your care and for choosing [facility],  Dr. Maren Beach  _______________________________________________________  If your blood pressure at your visit was 140/90 or greater, please contact your primary care physician to follow up on this.  _______________________________________________________  If you are age 64 or older, your body mass index should be between 23-30. Your Body mass index is 25.38 kg/m. If this is out of the aforementioned range listed, please consider follow up with your Primary Care Provider.  If you are age 27 or younger, your body mass index should be between 19-25. Your Body mass index is 25.38 kg/m. If this is out of the aformentioned range listed, please consider follow up with your Primary Care Provider.   ________________________________________________________  The [place] [facility] providers would like to encourage you to use [facility] to communicate with providers for non-urgent requests or questions.  Due to Older hold times on the telephone, sending your provider a message by [facility] may be a  faster and more efficient way to get a response.  Please allow 48 business hours for a response.  Please remember that this is for non-urgent requests.  _______________________________________________________

## 2023-11-10 ENCOUNTER — Encounter: Payer: Self-pay | Admitting: Pediatrics

## 2023-11-11 LAB — QUANTIFERON-TB GOLD PLUS
Mitogen-NIL: 9.09 [IU]/mL
NIL: 0.03 [IU]/mL
QuantiFERON-TB Gold Plus: NEGATIVE
TB1-NIL: 0 [IU]/mL
TB2-NIL: 0 [IU]/mL

## 2023-11-11 LAB — HEPATITIS B SURFACE ANTIGEN: Hepatitis B Surface Ag: NONREACTIVE

## 2023-11-11 LAB — IRON,TIBC AND FERRITIN PANEL
%SAT: 13 % — ABNORMAL LOW (ref 20–48)
Ferritin: 63 ng/mL (ref 38–380)
Iron: 45 ug/dL — ABNORMAL LOW (ref 50–180)
TIBC: 343 ug/dL (ref 250–425)

## 2023-11-11 LAB — HEPATITIS B CORE ANTIBODY, TOTAL: Hep B Core Total Ab: NONREACTIVE

## 2023-11-11 LAB — HEPATITIS B SURFACE ANTIBODY,QUALITATIVE: Hep B S Ab: NONREACTIVE

## 2023-11-19 LAB — SERIAL MONITORING

## 2023-11-20 ENCOUNTER — Encounter: Payer: Self-pay | Admitting: Pediatrics

## 2023-11-20 LAB — ADALIMUMAB+AB (SERIAL MONITOR)
Adalimumab Drug Level: 9.2 ug/mL
Anti-Adalimumab Antibody: <25 ng/mL

## 2023-12-02 ENCOUNTER — Other Ambulatory Visit: Payer: Self-pay | Admitting: Pediatrics

## 2024-02-01 ENCOUNTER — Encounter: Payer: Self-pay | Admitting: Pediatrics

## 2024-02-01 MED ORDER — HUMIRA (2 PEN) 40 MG/0.8ML ~~LOC~~ AJKT: AUTO-INJECTOR | SUBCUTANEOUS | 1 refills | Status: DC

## 2024-02-01 NOTE — Telephone Encounter (Signed)
 Please start PA for Humira  40 mg under the skin every 2 weeks, new insurance card uploaded to MyChart by patient. Thanks

## 2024-02-02 ENCOUNTER — Telehealth: Payer: Self-pay

## 2024-02-02 ENCOUNTER — Other Ambulatory Visit (HOSPITAL_COMMUNITY): Payer: Self-pay

## 2024-02-02 NOTE — Telephone Encounter (Signed)
 Pharmacy Patient Advocate Encounter   Received notification from Patient Advice Request messages that prior authorization for Humira  (2 Pen) 40MG /0.8ML auto-injector kit is required/requested.   Insurance verification completed.   The patient is insured through [facility] .   Per test claim: PA required; PA submitted to above mentioned insurance via CoverMyMeds Key/confirmation #/EOC [id number] Status is pending

## 2024-02-02 NOTE — Telephone Encounter (Signed)
 PA request has been Submitted. New Encounter has been or will be created for follow up. For additional info see Pharmacy Prior Auth telephone encounter from 02-02-2024.

## 2024-02-03 NOTE — Telephone Encounter (Signed)
 Patient notified via mychart

## 2024-02-03 NOTE — Telephone Encounter (Signed)
 Pharmacy Patient Advocate Encounter  Received notification from [employer] that Prior Authorization for Humira  (2 Pen) 40MG /0.8ML auto-injector kit has been APPROVED from 02-02-2024 to 02-01-2025   PA #/Case ID/Reference #: [id number]

## 2024-03-07 ENCOUNTER — Other Ambulatory Visit (HOSPITAL_COMMUNITY): Payer: Self-pay

## 2024-03-20 ENCOUNTER — Other Ambulatory Visit: Payer: Self-pay | Admitting: Pediatrics

## 2024-05-02 ENCOUNTER — Encounter: Payer: Self-pay | Admitting: Pediatrics

## 2024-07-13 ENCOUNTER — Telehealth: Payer: Self-pay | Admitting: Pediatrics

## 2024-07-13 NOTE — Telephone Encounter (Signed)
 [employer] needs to have active coverage information in order to process Humira  Pen. They have been unable to reach the patient. The form that they need is under the media tab.

## 2024-07-14 NOTE — Telephone Encounter (Signed)
 Form printed, completed and faxed to [employer]. A copy of the demographic and the insurance card for patient were needed.

## 2024-08-18 ENCOUNTER — Encounter: Payer: Self-pay | Admitting: Pediatrics

## 2024-08-22 ENCOUNTER — Other Ambulatory Visit (HOSPITAL_COMMUNITY): Payer: Self-pay

## 2024-08-23 ENCOUNTER — Telehealth: Payer: Self-pay

## 2024-08-23 ENCOUNTER — Other Ambulatory Visit (HOSPITAL_COMMUNITY): Payer: Self-pay

## 2024-08-23 NOTE — Telephone Encounter (Signed)
 PA request has been Submitted. New Encounter has been or will be created for follow up. For additional info see Pharmacy Prior Auth telephone encounter from 08-23-2024.

## 2024-08-23 NOTE — Telephone Encounter (Signed)
 Pharmacy Patient Advocate Encounter   Received notification from Patient Advice Request messages that prior authorization for Humira  (2 Syringe) 40MG /0.8ML syringe kit is required/requested.   Insurance verification completed.   The patient is insured through [facility].   Per test claim: PA required; PA submitted to above mentioned insurance via Latent Key/confirmation #/EOC [id number] Status is pending

## 2024-08-24 ENCOUNTER — Other Ambulatory Visit (HOSPITAL_COMMUNITY): Payer: Self-pay

## 2024-08-24 NOTE — Telephone Encounter (Signed)
 Pharmacy Patient Advocate Encounter  Received notification from [employer] that Prior Authorization for Humira  (2 Syringe) 40MG /0.8ML syringe kit has been DENIED.  Full denial letter will be uploaded to the media tab. See denial reason below.  The request was denied because you did not meet the following requirements:  This medicine is covered only if:  One of the following:  (1) Submission of medical records documenting you allergy or demonstrated intolerance to the inactive ingredients in Adalimumab -adaz (unbranded Hyrimoz ), and Amjevita  (2) Both of the following:  (A) Submission of medical records documenting you have previously been successfully treated with brand Humira  (B) Submission of medical records documenting you have tried Adalimumab -adaz (unbranded Hyrimoz ), and Amjevita  for 6 to 8 weeks per product with a decrease in effectiveness.  (3) Prescriber is requesting continuation of therapy for an established member on Humira  (as documented by a paid claim of at least 28 day supply in past 120 days)  PA #/Case ID/Reference #: [id number]

## 2024-08-24 NOTE — Telephone Encounter (Signed)
 Both show up as needed a prior authorization. I am submitting for one and a new encounter will be created.

## 2024-08-28 ENCOUNTER — Telehealth: Payer: Self-pay

## 2024-08-28 ENCOUNTER — Other Ambulatory Visit (HOSPITAL_COMMUNITY): Payer: Self-pay

## 2024-08-28 NOTE — Telephone Encounter (Signed)
 Voicemail left to call back, MyChart message sent as well.

## 2024-08-28 NOTE — Telephone Encounter (Signed)
 Inbound call from patient stating he was returning a call back to [place]. Patient is requesting a call back. Please advise.

## 2024-08-28 NOTE — Telephone Encounter (Signed)
 Patient called back regarding Adalimumab -adaz 40MG /0.4ML. Patient verbalized understanding and all questions were answered.

## 2024-08-28 NOTE — Telephone Encounter (Signed)
 Pharmacy Patient Advocate Encounter   Received notification from CoverMyMeds that prior authorization for Adalimumab -adaz 40MG /0.4ML auto-injectors is required/requested.   Insurance verification completed.   The patient is insured through [facility].   Prior Authorization for Adalimumab -adaz 40MG /0.4ML auto-injectors has been APPROVED from 08-23-2024 to 08-23-2025  Patient is required to fill at [facility]  PA #/Case ID/Reference #: [id number]

## 2024-08-31 NOTE — Telephone Encounter (Signed)
 See phone note 08/28/24.
